# Patient Record
Sex: Female | Born: 1945 | Race: White | Hispanic: No | Marital: Single
Health system: Midwestern US, Community
[De-identification: ages and names within clinical notes are randomized; demographics above are authoritative.]

## PROBLEM LIST (undated history)

## (undated) DIAGNOSIS — Z1211 Encounter for screening for malignant neoplasm of colon: Secondary | ICD-10-CM

## (undated) DIAGNOSIS — R195 Other fecal abnormalities: Principal | ICD-10-CM

## (undated) DIAGNOSIS — K429 Umbilical hernia without obstruction or gangrene: Secondary | ICD-10-CM

## (undated) DIAGNOSIS — Z1231 Encounter for screening mammogram for malignant neoplasm of breast: Secondary | ICD-10-CM

## (undated) DIAGNOSIS — K439 Ventral hernia without obstruction or gangrene: Secondary | ICD-10-CM

## (undated) DIAGNOSIS — C3490 Malignant neoplasm of unspecified part of unspecified bronchus or lung: Principal | ICD-10-CM

---

## 2019-02-08 ENCOUNTER — Ambulatory Visit: Admit: 2019-02-08 | Discharge: 2019-02-08 | Payer: MEDICARE | Attending: Family Medicine | Primary: Family Medicine

## 2019-02-08 DIAGNOSIS — Z1231 Encounter for screening mammogram for malignant neoplasm of breast: Secondary | ICD-10-CM

## 2019-02-08 NOTE — Progress Notes (Signed)
Subjective:      Patient ID: Diana Willis is a 73 y.o. female.    well      Review of Systems   Constitutional: Negative.    All other systems reviewed and are negative.      Objective:   Physical Exam  Vitals signs reviewed.   Constitutional:       Appearance: Normal appearance.   Neurological:      Mental Status: She is alert.   Psychiatric:         Thought Content: Thought content normal.         Assessment:      1. Screening breast examination    2. Well adult exam            Plan:      Diana Willis was seen today for orders.    Diagnoses and all orders for this visit:    Screening breast examination  -     MAM DIGITAL SCREEN W OR WO CAD BILATERAL; Future    Well adult exam                Electronically signed by Ulice Dash Maalle Starrett, MD on 02/08/2019 at 12:43 PM

## 2019-02-14 NOTE — Addendum Note (Signed)
Addended by: Lynnette Caffey on: 02/14/2019 08:16 AM     Modules accepted: Level of Service

## 2019-10-02 ENCOUNTER — Telehealth

## 2019-10-02 NOTE — Telephone Encounter (Signed)
Cologuard order please

## 2019-10-18 NOTE — Telephone Encounter (Signed)
Had mammogram done a couple months ago and had it done at [facility]. Leane Call

## 2019-10-27 LAB — FECAL DNA COLORECTAL CANCER SCREENING (COLOGUARD): FIT-DNA (Cologuard): NEGATIVE

## 2019-10-30 ENCOUNTER — Encounter

## 2019-11-20 NOTE — Telephone Encounter (Signed)
pt called regards to results for cologuard and would like a called back from clinical staff

## 2019-11-22 NOTE — Telephone Encounter (Signed)
Diana Willis was contacted as part of mammography outreach. Patient has a mammography scheduled prior to outreach.     Patient had a mammogram done at [facility]. [facility]    [place]

## 2020-02-12 ENCOUNTER — Telehealth

## 2020-02-12 NOTE — Telephone Encounter (Signed)
CALLED PT AND GOT HER MAMM ORDER MADE, WILL FAX TO [facility]

## 2020-02-12 NOTE — Telephone Encounter (Signed)
-----   Message from Arnell Asal sent at 02/12/2020 11:01 AM EDT -----  Subject: Message to Provider    QUESTIONS  Information for Provider? Pt called stating she received a call from [facility] to schedule her yearly Mammogram. She stated she needs an   Order for it and asked if Dr. Malachi Carl could send an Order for the Mamm to [facility]? Please call pt to let her know. Thank you   ---------------------------------------------------------------------------  --------------  CALL BACK INFO  What is the best way for the office to contact you? OK to leave message on   voicemail  Preferred Call Back Phone Number? [phone]  ---------------------------------------------------------------------------  --------------  SCRIPT ANSWERS  Relationship to Patient? Self

## 2021-01-31 ENCOUNTER — Inpatient Hospital Stay: Admit: 2021-01-31 | Payer: MEDICARE | Primary: Family Medicine

## 2021-01-31 DIAGNOSIS — Z1231 Encounter for screening mammogram for malignant neoplasm of breast: Secondary | ICD-10-CM

## 2022-10-26 LAB — CBC WITH AUTO DIFFERENTIAL
Basophils %: 1 % (ref 0–2)
Basophils Absolute: 0.05 10*3/uL (ref 0.00–0.20)
Eosinophils %: 1 % (ref 1–4)
Eosinophils Absolute: 0.13 10*3/uL (ref 0.00–0.44)
Hematocrit: 49.2 % — ABNORMAL HIGH (ref 36.3–47.1)
Hemoglobin: 15.8 g/dL — ABNORMAL HIGH (ref 11.9–15.1)
Immature Granulocytes %: 0 %
Immature Granulocytes Absolute: 0.04 10*3/uL (ref 0.00–0.30)
Lymphocytes %: 22 % — ABNORMAL LOW (ref 24–43)
Lymphocytes Absolute: 2.15 10*3/uL (ref 1.10–3.70)
MCH: 29.7 pg (ref 25.2–33.5)
MCHC: 32.1 g/dL (ref 28.4–34.8)
MCV: 92.5 fL (ref 82.6–102.9)
MPV: 11.1 fL (ref 8.1–13.5)
Monocytes %: 8 % (ref 3–12)
Monocytes Absolute: 0.8 10*3/uL (ref 0.10–1.20)
NRBC Automated: 0 per 100 WBC
Neutrophils %: 68 % — ABNORMAL HIGH (ref 36–65)
Neutrophils Absolute: 6.64 10*3/uL (ref 1.50–8.10)
Platelets: 245 10*3/uL (ref 138–453)
RBC: 5.32 m/uL — ABNORMAL HIGH (ref 3.95–5.11)
RDW: 14.3 % (ref 11.8–14.4)
WBC: 9.8 10*3/uL (ref 3.5–11.3)

## 2022-10-26 LAB — COMPREHENSIVE METABOLIC PANEL
ALT: 13 U/L (ref 10–35)
AST: 14 U/L (ref 10–35)
Albumin/Globulin Ratio: 2 (ref 1.0–2.5)
Albumin: 4.4 g/dL (ref 3.5–5.2)
Alkaline Phosphatase: 96 U/L (ref 35–104)
Anion Gap: 10 mmol/L (ref 9–16)
BUN: 23 mg/dL (ref 8–23)
CO2: 28 mmol/L (ref 20–31)
Calcium: 9.2 mg/dL (ref 8.6–10.4)
Chloride: 103 mmol/L (ref 98–107)
Creatinine: 1 mg/dL — ABNORMAL HIGH (ref 0.50–0.90)
Est, Glom Filt Rate: 62 mL/min/{1.73_m2} (ref >60)
Glucose: 89 mg/dL (ref 74–99)
Potassium: 4.7 mmol/L (ref 3.7–5.3)
Sodium: 141 mmol/L (ref 136–145)
Total Bilirubin: 0.4 mg/dL (ref 0.00–1.20)
Total Protein: 6.8 g/dL (ref 6.6–8.7)

## 2022-10-26 LAB — LIPID PANEL
Chol/HDL Ratio: 5
Cholesterol, Total: 247 mg/dL — ABNORMAL HIGH (ref 0–199)
HDL: 46 mg/dL (ref >40)
LDL Cholesterol: 165 mg/dL — ABNORMAL HIGH (ref 0–100)
Triglycerides: 177 mg/dL — ABNORMAL HIGH (ref <150)
VLDL: 35 mg/dL

## 2022-10-26 LAB — TSH: TSH: 3.16 u[IU]/mL (ref 0.27–4.20)

## 2022-10-26 LAB — HEMOGLOBIN A1C
Estimated Avg Glucose: 114 mg/dL
Hemoglobin A1C: 5.6 % (ref 4.0–6.0)

## 2022-10-28 ENCOUNTER — Encounter

## 2022-10-29 ENCOUNTER — Encounter

## 2022-11-03 ENCOUNTER — Inpatient Hospital Stay: Admit: 2022-11-03 | Payer: MEDICARE | Primary: Family Medicine

## 2022-11-03 DIAGNOSIS — K429 Umbilical hernia without obstruction or gangrene: Secondary | ICD-10-CM

## 2022-11-05 ENCOUNTER — Encounter

## 2022-11-06 ENCOUNTER — Encounter

## 2022-11-09 ENCOUNTER — Inpatient Hospital Stay: Admit: 2022-11-09 | Discharge: 2022-11-09 | Payer: MEDICARE | Primary: Family Medicine

## 2022-11-09 DIAGNOSIS — K439 Ventral hernia without obstruction or gangrene: Secondary | ICD-10-CM

## 2022-12-04 ENCOUNTER — Ambulatory Visit: Payer: PRIVATE HEALTH INSURANCE | Primary: Family Medicine

## 2022-12-04 ENCOUNTER — Ambulatory Visit: Payer: MEDICARE | Primary: Family Medicine

## 2022-12-04 DIAGNOSIS — Z1231 Encounter for screening mammogram for malignant neoplasm of breast: Secondary | ICD-10-CM

## 2023-07-09 LAB — FECAL DNA COLORECTAL CANCER SCREENING (COLOGUARD): FIT-DNA (Cologuard): POSITIVE — AB

## 2023-07-15 IMAGING — CT CT LUNG SCREENING
2 of 4 series · 7 of 36 positions shown, 8 images · non-contrast
Comparison: None   
Count of known CT and Cardiac Nuclear Medicine studies performed in the previous 
12 months = 0.
COMPARISON: None   
Count of known CT and Cardiac Nuclear Medicine studies performed in the previous 
12 months = 0.

________________________________________________________________________________________________ 
******** ADDENDUM #1 ********/n 
CT LUNG SCREENING, 07/15/2023 [DATE]: 
CLINICAL INDICATION: Personal history of nicotine dependence. 60 pack year 
smoking history. Patient is a current smoker. 
A search for DICOM formatted images was conducted for prior CT imaging studies 
completed at a non-affiliated media free facility.
TECHNIQUE: The chest was scanned from base of neck through the lung bases 
without contrast on a high resolution low dose CT scanner. Routine MPR and MIP 
reconstruction images were performed.

[Series 7: coronal · coronal · 0.61mm/px · 3 of 134 slices shown]
[im 27/134  lung]
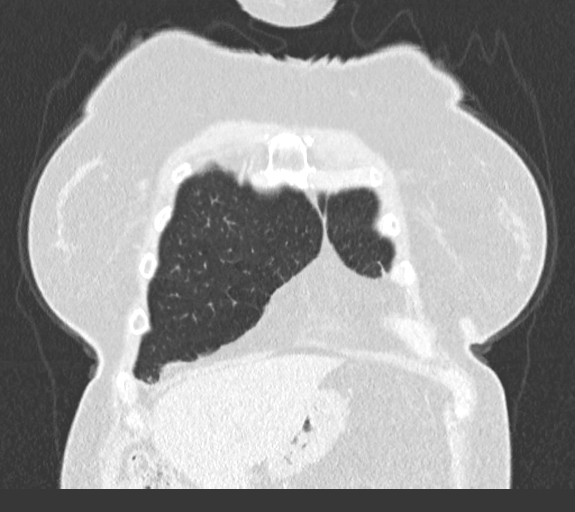
[im 54/134  lung]
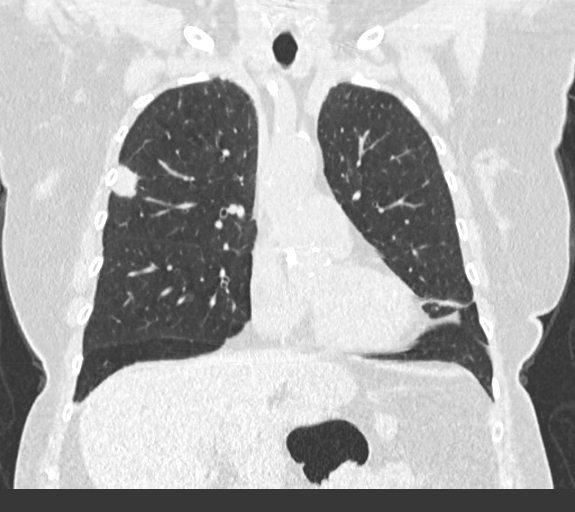
[im 80/134  lung]
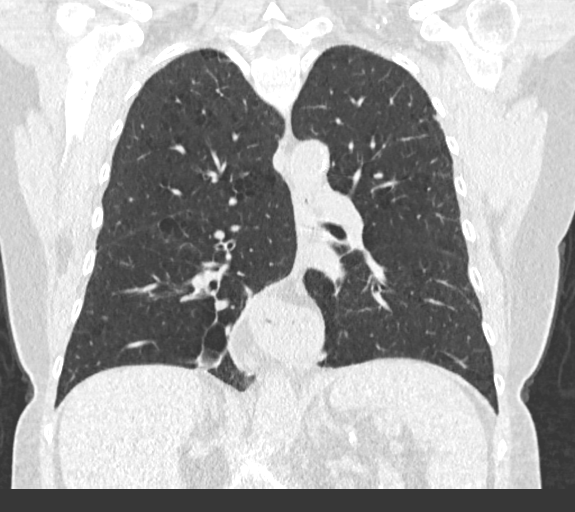

[Series 9: lung mips · axial · 0.53mm/px · z∈[+456,+605]mm · 4 of 42 slices shown, 5 images]
[im 9/42  mediastinal]
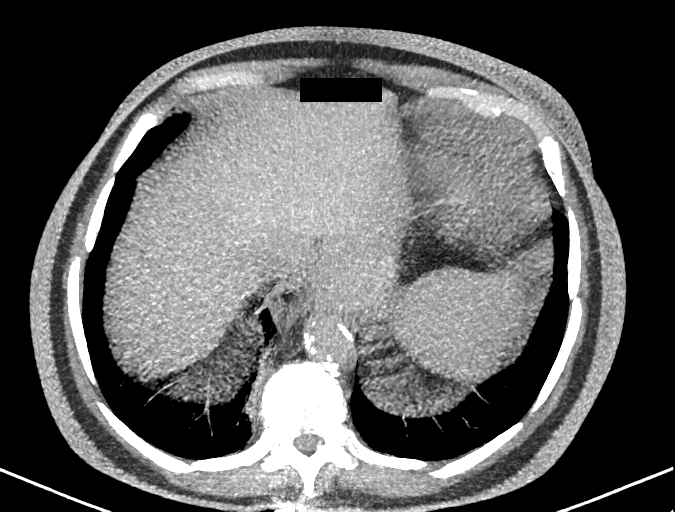
[im 9/42  lung]
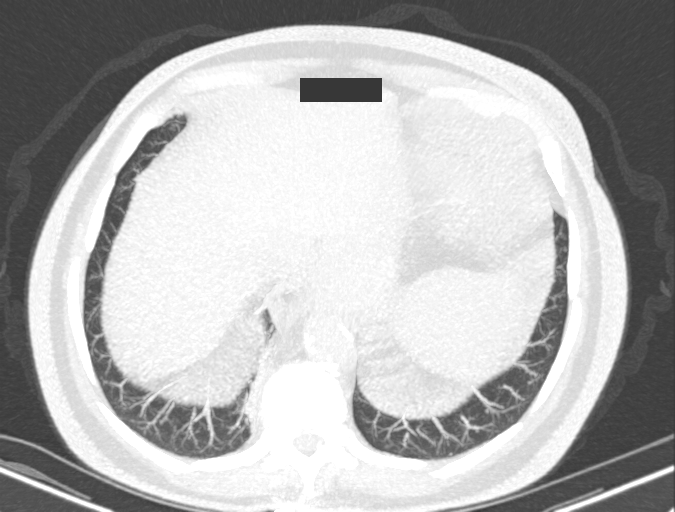
[im 17/42  lung]
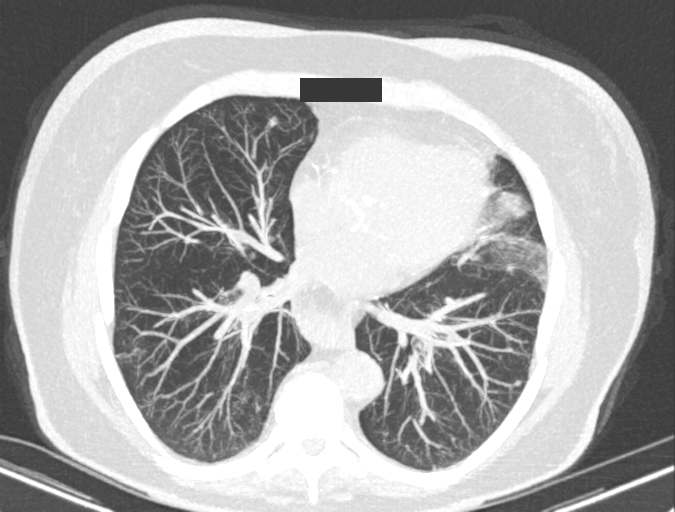
[im 25/42  lung]
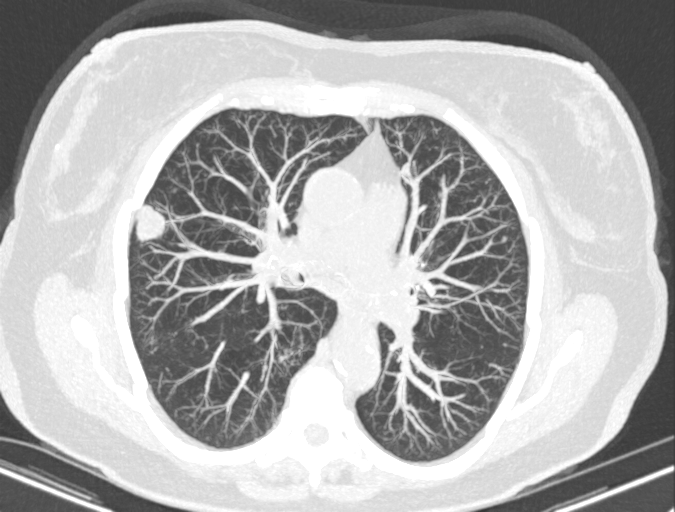
[im 33/42  lung]
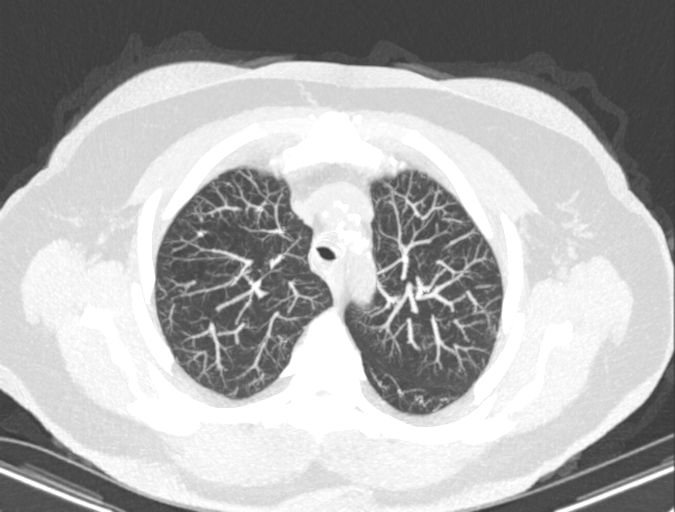

[7 of 36 positions shown; findings below may reference images not displayed]

FINDINGS: LUNGS AND PLEURA:  19.4 x 16.7 mm noncalcified spiculated RUL nodule abuts the 
lateral pleura and is consistent with malignancy until proven otherwise. Several 
scattered noncalcified pulmonary nodules, with the largest measuring 3.4 x
mm in the LLL (image 22). Moderate centrilobular emphysema and mild basilar 
atelectasis. No pleural effusion.  
MEDIASTINUM:  No adenopathy. Moderate hiatal hernia. Normal heart size. No 
pericardial effusion. Atherosclerosis and coronary arterial calcifications. 
CHEST WALL/AXILLA: No mass or adenopathy.  
UPPER ABDOMEN: Negative. 
MUSCULOSKELETAL: Degenerative change and mild chronic T11 loss of height and 
osteopenia.
IMPRESSION: 1.  19.4 x 16.7 mm noncalcified spiculated RUL nodule abuts the lateral pleura 
and is consistent with malignancy until proven otherwise. PET/CT and pulmonary 
consultation may be helpful for further evaluation. 
2.  Several scattered noncalcified pulmonary nodules (up to 3.4 x 3.3 mm).  
3.  Moderate centrilobular emphysema and mild basilar atelectasis.  
4.  Moderate hiatal hernia.  
5.  Degenerative change and mild chronic T11 loss of height and osteopenia. 
L-RADS: Category 4X (suspicious, >15% chance of malignancy) 
PET/CT may be used if there is a greater than or equal to 8 mm solid component 

RADIATION DOSE REDUCTION: All CT scans are performed using radiation dose 
reduction techniques, when applicable.  Technical factors are evaluated and 
adjusted to ensure appropriate moderation of exposure.  Automated dose 
management technology is applied to adjust the radiation doses to minimize 
exposure while achieving diagnostic quality images. 
CT LUNG SCREENING, 07/15/2023 [DATE]: 
CLINICAL INDICATION: Personal history of nicotine dependence. 60 pack year 
smoking history. Patient is a current smoker. 
A search for DICOM formatted images was conducted for prior CT imaging studies 
completed at a non-affiliated media free facility.
FINDINGS: LUNGS AND PLEURA:  19.4 x 16.7 cm noncalcified spiculated RUL nodule abuts the 
lateral pleura and is consistent with malignancy until proven otherwise. Several 
scattered noncalcified pulmonary nodules, with the largest measuring 3.4 x
mm in the LLL (image 22). Moderate centrilobular emphysema and mild basilar 
atelectasis. No pleural effusion.  
MEDIASTINUM:  No adenopathy. Moderate hiatal hernia. Normal heart size. No 
pericardial effusion. Atherosclerosis and coronary arterial calcifications. 
CHEST WALL/AXILLA: No mass or adenopathy.  
UPPER ABDOMEN: Negative. 
MUSCULOSKELETAL: Degenerative change and mild chronic T11 loss of height and 
osteopenia.
IMPRESSION: 1.  19.4 x 16.7 cm noncalcified spiculated RUL nodule abuts the lateral pleura 
and is consistent with malignancy until proven otherwise. PET/CT and pulmonary 
consultation may be helpful for further evaluation. 
2.  Several scattered noncalcified pulmonary nodules (up to 3.4 x 3.3).  
3.  Moderate centrilobular emphysema and mild basilar atelectasis.  
4.  Moderate hiatal hernia.  
5.  Degenerative change and mild chronic T11 loss of height and osteopenia. 
L-RADS: Category 4X (suspicious, >15% chance of malignancy) 
PET/CT may be used if there is a greater than or equal to 8 mm solid component 

RADIATION DOSE REDUCTION: All CT scans are performed using radiation dose 
reduction techniques, when applicable.  Technical factors are evaluated and 
adjusted to ensure appropriate moderation of exposure.  Automated dose 
management technology is applied to adjust the radiation doses to minimize 
exposure while achieving diagnostic quality images.

## 2023-07-15 IMAGING — CT CT ABDOMEN AND PELVIS W/WO CONTRAST
2 of 4 series · 15 of 46 positions shown, 17 images · IV contrast (APPLIED)
Comparison: None 
Count of known CT and Cardiac Nuclear Medicine studies performed in the previous 
12 months = 0.

________________________________________________________________________________________________ 
CT ABDOMEN AND PELVIS W/WO CONTRAST, 07/15/2023 [DATE]: 
CLINICAL INDICATION: Diaphragmatic Hernia Without Obstruction Or Gangrene 
A search for DICOM formatted images was conducted for prior CT imaging studies 
completed at a non-affiliated media free facility.
TECHNIQUE: The abdomen and pelvis were scanned from lung bases through the pubic 
rami without and with 75 mL of Isovue 300 MDV contrast injected intravenously on 
a high-resolution CT scanner using dose reduction techniques. Routine MPR 
reconstructions were performed.

[Series 11: abd/pel ax w · axial · 0.76mm/px · z∈[+91,+475]mm · 12 of 144 slices shown, 14 images]
[im 8/144  soft-tissue]
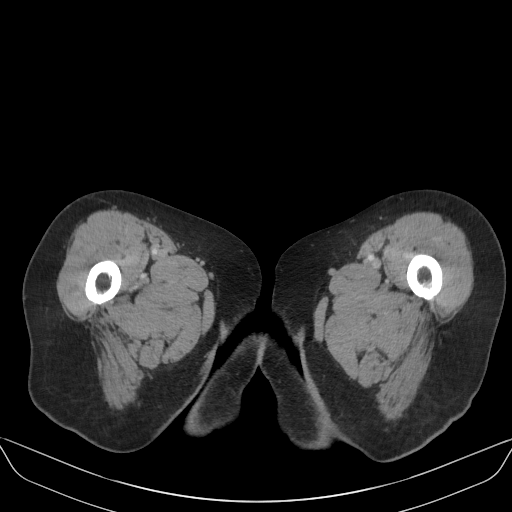
[im 8/144  bone]
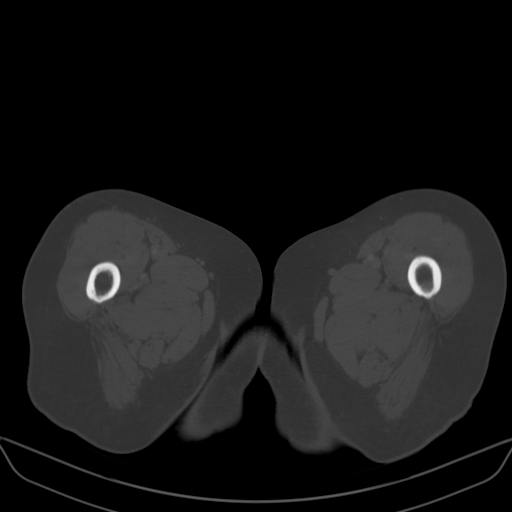
[im 22/144  soft-tissue]
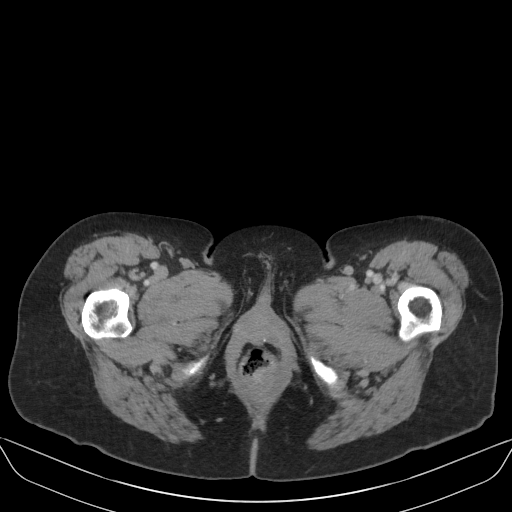
[im 29/144  soft-tissue]
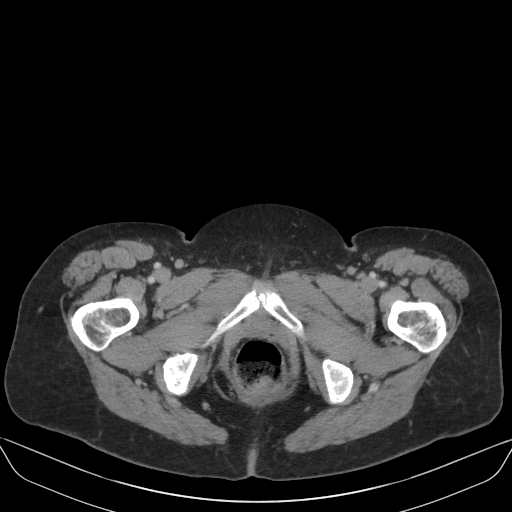
[im 43/144  soft-tissue]
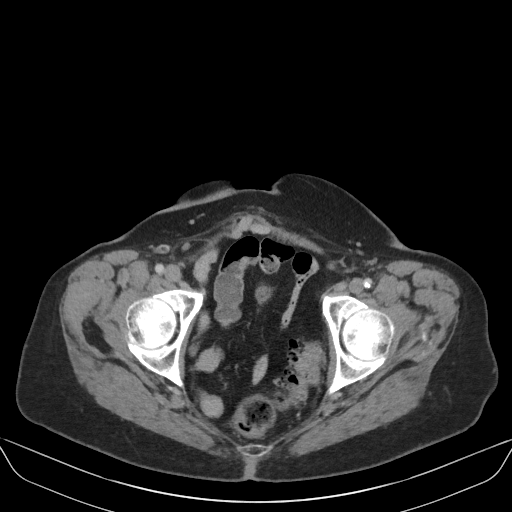
[im 58/144  soft-tissue]
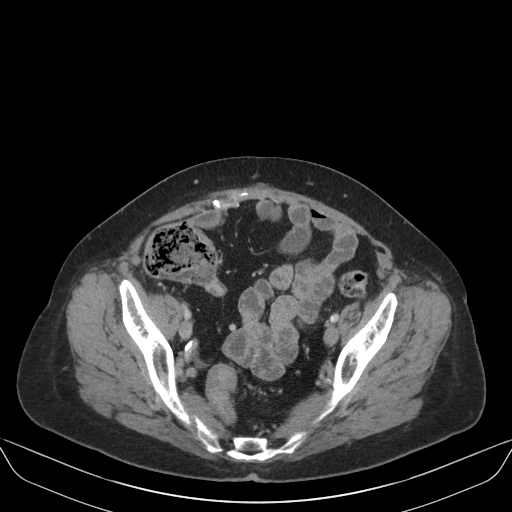
[im 65/144  soft-tissue]
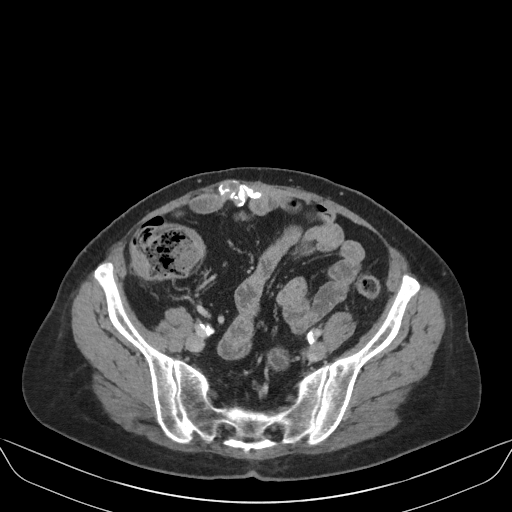
[im 79/144  soft-tissue]
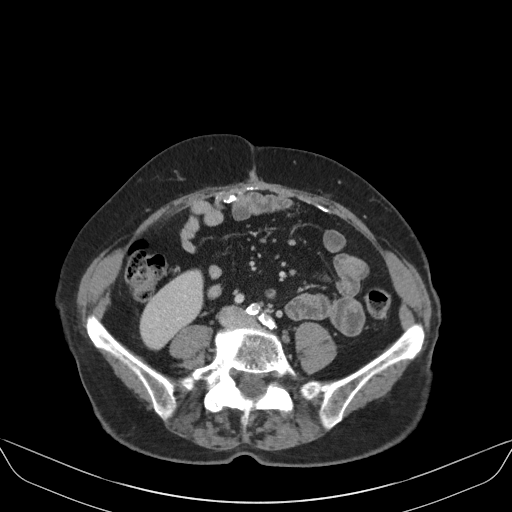
[im 86/144  soft-tissue]
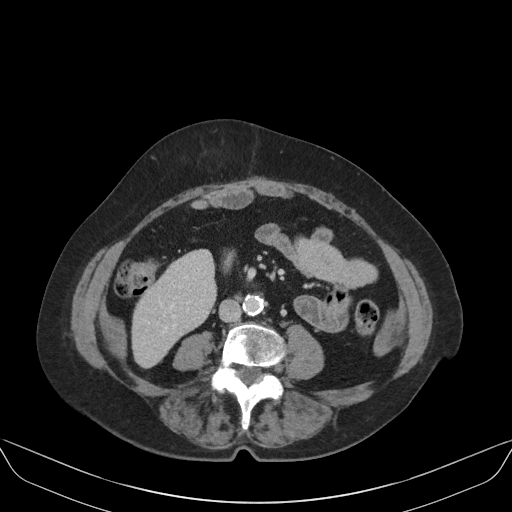
[im 101/144  soft-tissue]
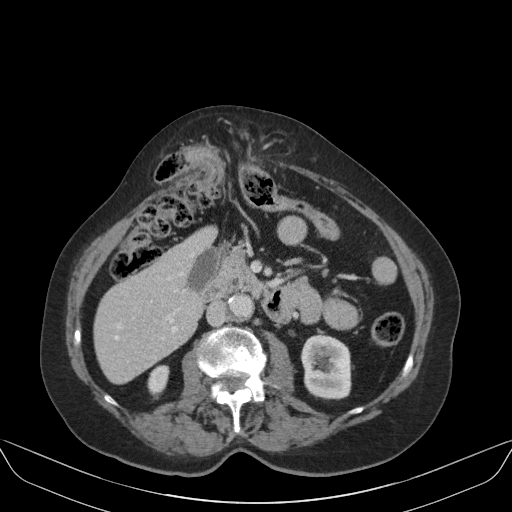
[im 101/144  bone]
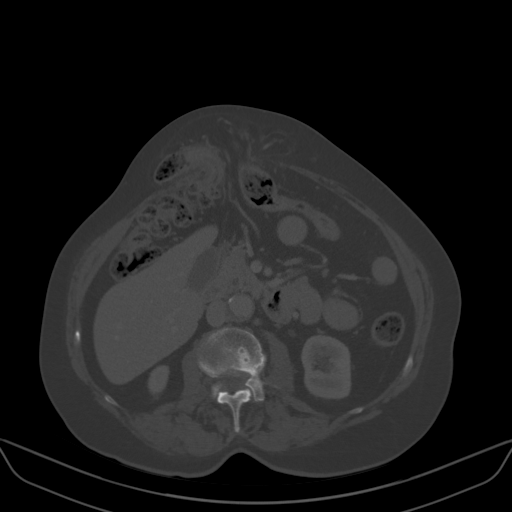
[im 115/144  soft-tissue]
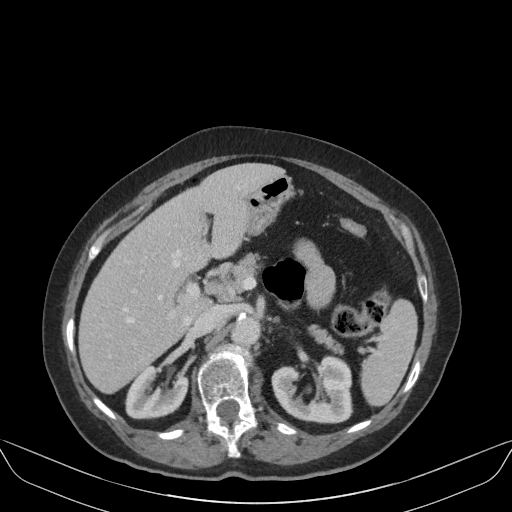
[im 122/144  soft-tissue]
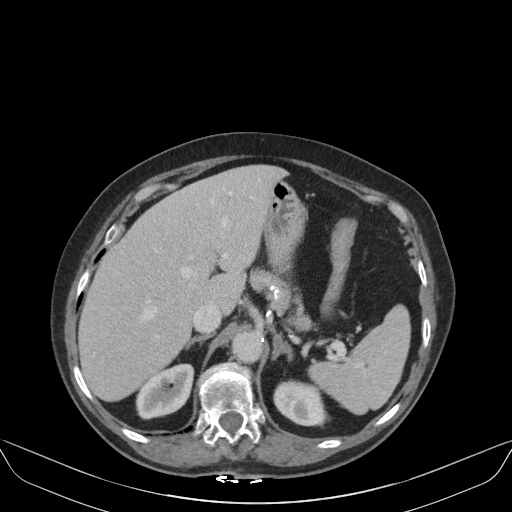
[im 136/144  soft-tissue]
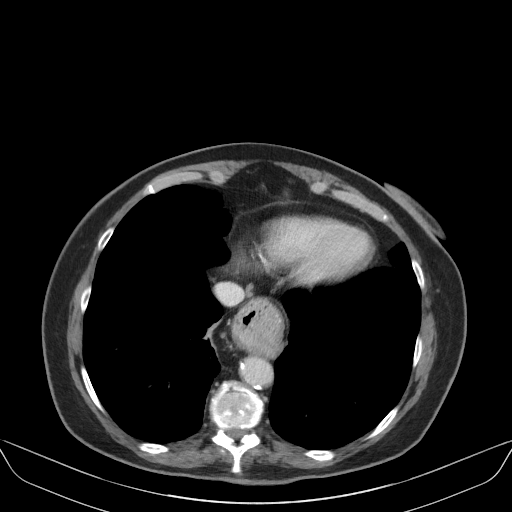

[Series 12: abd/pel cor w · coronal · 0.80mm/px · 3 of 155 slices shown]
[im 52/155  soft-tissue]
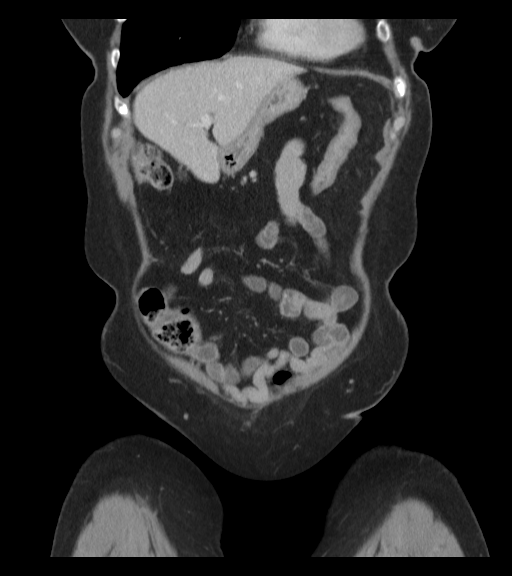
[im 69/155  soft-tissue]
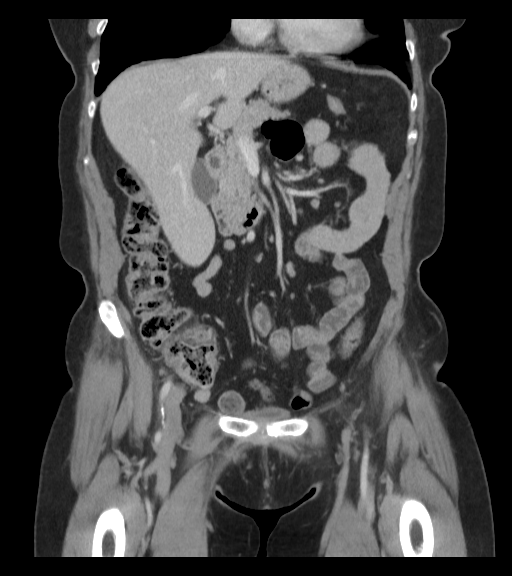
[im 86/155  soft-tissue]
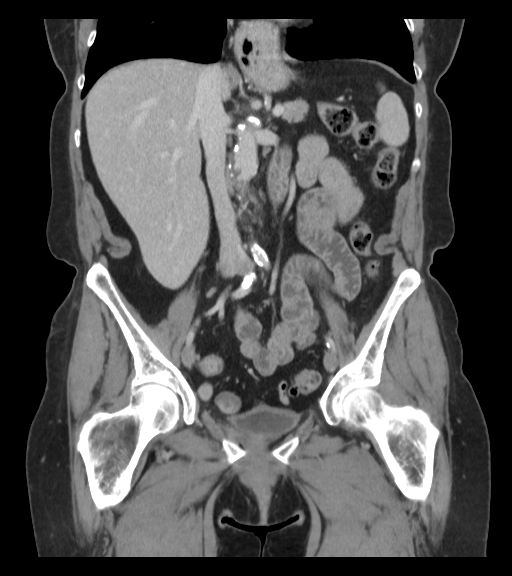

[15 of 46 positions shown; findings below may reference images not displayed]

FINDINGS: LUNG BASES: Localizer view demonstrates right upper lobe mass, consistent with 
malignancy interpreted otherwise. Scattered basilar noncalcified micronodules, 
basilar centrilobular emphysema and atelectasis. No pleural effusions. 
HEPATOBILIARY: No mass or biliary dilatation. No gallstones. 
SPLEEN: Normal in size. 
PANCREAS: No ductal dilatation or mass.   
ADRENALS: No mass. 
GENITOURINARY: No enhancing mass or hydronephrosis.  Bladder is unremarkable. 
Hysterectomy. 
LYMPH NODES: No adenopathy. 
STOMACH, SMALL BOWEL AND COLON: Moderate hiatal hernia. Nonobstructed transverse 
colon extends into the right paramidline ventral hernia. Colonic diverticulosis 
without evidence of diverticulitis. No bowel wall thickening or obstruction. 
VASCULAR STRUCTURES: No aneurysm. Atherosclerosis. 
MUSCULOSKELETAL: Bilateral paramidline supraumbilical ventral hernias contains 
nonobstructed transverse colon on the right and mesenteric fat on the left. The 
right hernia wall defect measures 1.4 x 2.6 cm and the hernia sac measures up to 
9.6 cm. Prior ventral herniorrhaphy with partially detached mesh and coarse 
calcifications. Degenerative change, L5-S1 grade 1 anterolisthesis and 
osteopenia. 
ADDITIONAL FINDINGS: Pelvic floor laxity.
IMPRESSION: 1.  Localizer view demonstrates right upper lobe mass, consistent with 
malignancy until proven otherwise. Please refer to 07/15/2023 lung cancer 
screening CT for additional findings. 
2.  No evidence of abdominal/pelvic metastases. 
3.  Moderate hiatal hernia. 
4.  Colonic diverticulosis. 
5.  Bilateral paramidline supraumbilical ventral hernias contains nonobstructed 
transverse colon on the right and mesenteric fat on the left. Prior ventral 
herniorrhaphy with partially detached mesh.  
6.  Atherosclerosis. 
7.  Pelvic floor laxity. 
8.  Degenerative change, L5-S1 grade 1 anterolisthesis and osteopenia. 
RADIATION DOSE REDUCTION: All CT scans are performed using radiation dose 
reduction techniques, when applicable.  Technical factors are evaluated and 
adjusted to ensure appropriate moderation of exposure.  Automated dose 
management technology is applied to adjust the radiation doses to minimize 
exposure while achieving diagnostic quality images.

## 2023-07-23 IMAGING — CT PET CT SCAN TUMOR IMAGING SKULL TO THIGH
3 series · 25 of 25 positions shown · non-contrast
Comparison: CT scan 07/15/2023

________________________________________________________________________________________________ 
******** ADDENDUM #1 ********/n 
Was requested to confirm that the nodule in the right upper lobe is correctly 
measured. I am reviewing the films and on image 68 the lesion measures 1.9 x
cm. On the diagnostic exam from [DATE] this was stated to measure 1.9 x 1.7 cm. 
The 1 mm difference could be within measurement error. The smaller pulmonary 
nodules not as well seen on this low-dose CT obtained for PET scan. 
PET CT SCAN TUMOR IMAGING SKULL TO THIGH, 07/23/2023 [DATE]: 
CLINICAL INDICATION: Other Nonspecific Abnormal Finding Of Lung Field 
Diagnostic
TECHNIQUE: A dose of 11.9 mCi of F-18 FDG was administered intravenously and 
skull to thigh PET scanning was performed at 60 minutes. Tomographic scans were 
reconstructed in axial, coronal, and sagittal projections. The data was 
reconstructed into a three-dimensional volume rendered image and reviewed in a 
rotational cine loop. Serum blood glucose at the time of injection was 99 mg/dl.

[Series 201: body-low dose ct, idose (4) · axial · 4.0mm · 1.17mm/px · z∈[-1177,-329]mm · 9 of 213 slices shown]
[im 1/213]
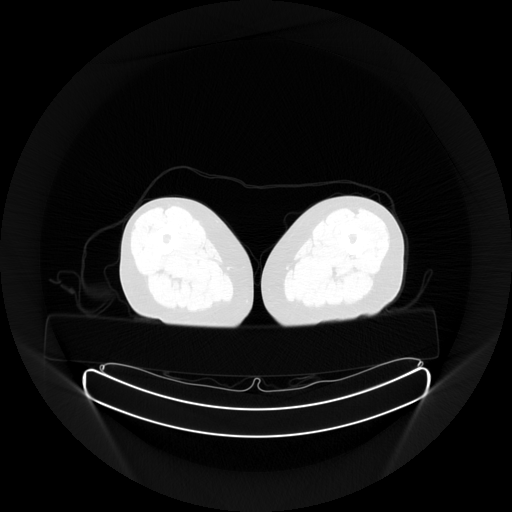
[im 27/213]
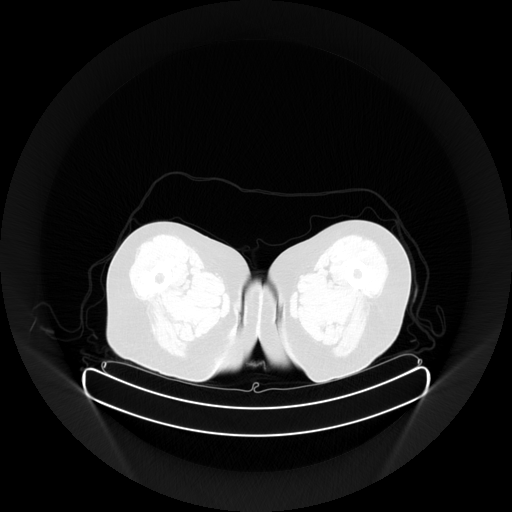
[im 54/213]
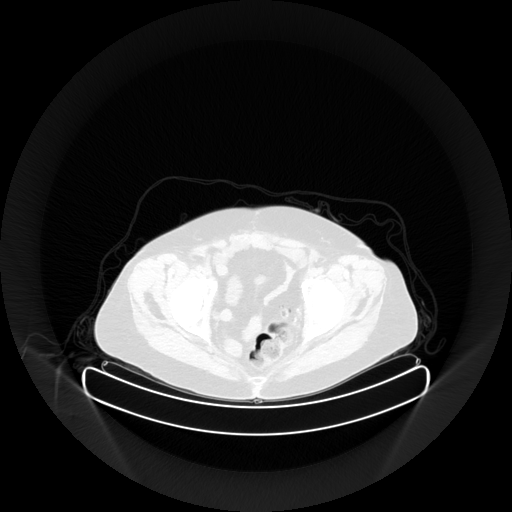
[im 80/213]
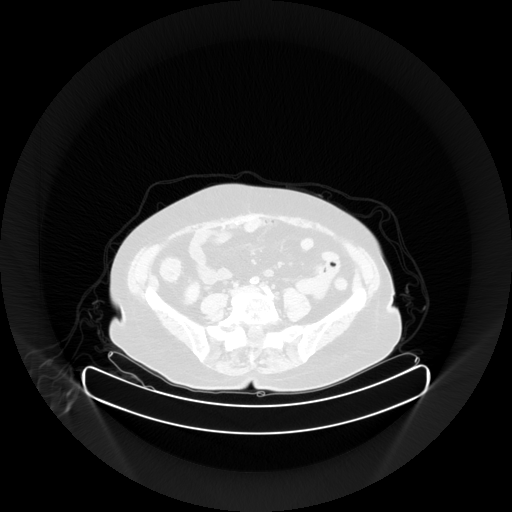
[im 107/213]
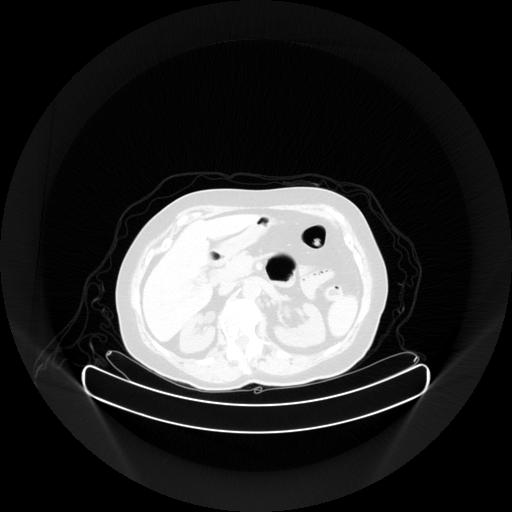
[im 133/213]
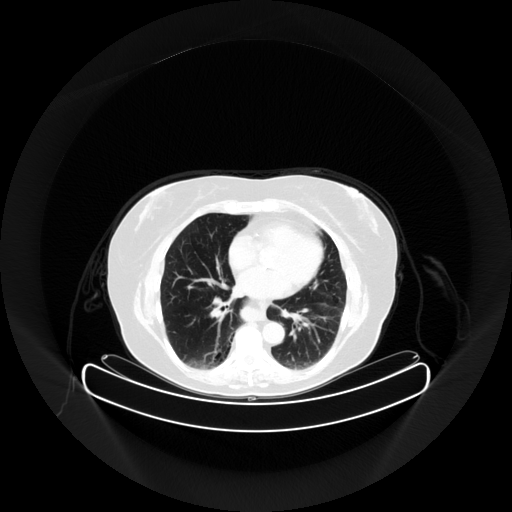
[im 160/213]
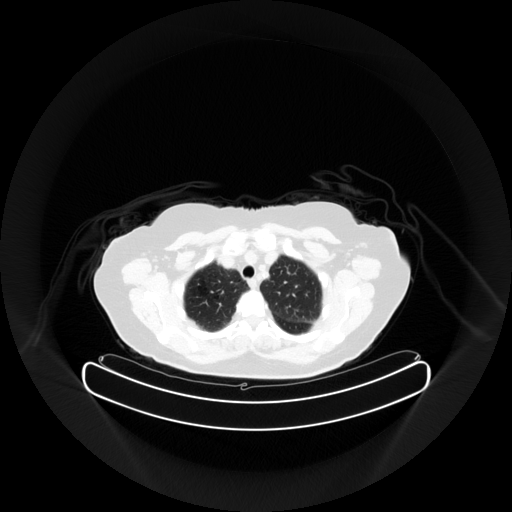
[im 186/213]
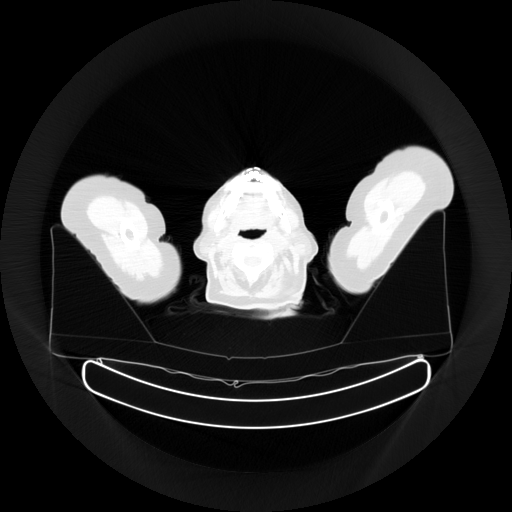
[im 213/213]
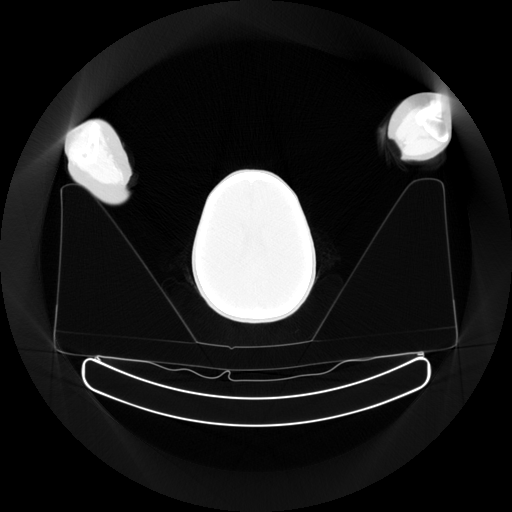

[Series 4546: (wb_nac) body · axial · 4.0mm · 4.00mm/px · z∈[-1177,-329]mm · 8 of 213 slices shown]
[im 1/213]
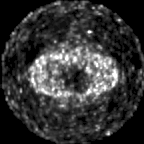
[im 31/213]
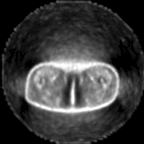
[im 61/213]
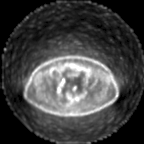
[im 91/213]
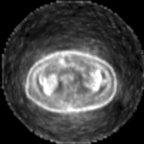
[im 122/213]
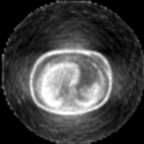
[im 152/213]
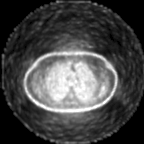
[im 182/213]
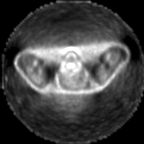
[im 213/213]
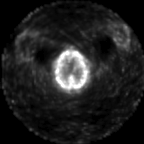

[Series 4547: (wb_ctac) body · axial · 4.0mm · 4.00mm/px · z∈[-1177,-329]mm · 8 of 213 slices shown]
[im 1/213]
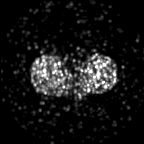
[im 31/213]
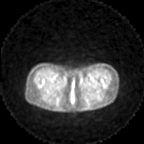
[im 61/213]
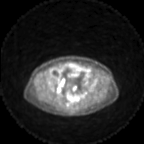
[im 91/213]
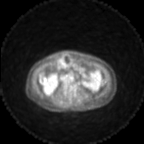
[im 122/213]
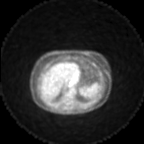
[im 152/213]
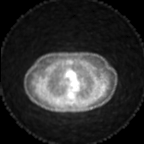
[im 182/213]
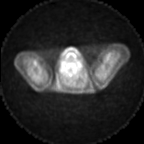
[im 213/213]
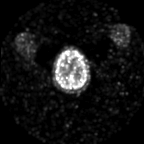

[25 of 25 positions shown; findings below may reference images not displayed]

FINDINGS: NECK/CHEST: There is activity identified involving the parotid glands 
bilaterally. On the left on image 29 there is a 1.5 x 0.8 cm nodule with a 
maximum SUV of 5.4. A smaller 0.5 x 0.4 cm nodule seen on the right with maximum 
SUV of 2.3. No adenopathy or additional abnormal activity in the neck. 
There is a pulmonary lesion in the right upper lobe. Currently measures 
approximately 1.9 x 1.6 cm. Significantly FDG avid with a maximum SUV of 15.0. 
No additional FDG avid nodule seen. No adenopathy identified. 
ABDOMEN/PELVIS: No abnormal FDG uptake. 
MUSCULOSKELETAL: No abnormal FDG uptake. 
ADDITIONAL CT FUSION FINDINGS: Emphysematous changes. Atherosclerotic changes 
with at least mild coronary calcifications. Degenerative changes. There is a 
moderate to large hiatal hernia. Ventral hernia described previously containing 
fat and nonobstructing portions of the transverse colon. Below the hernia are 
changes of previous hernia repair. Previous hysterectomy.
IMPRESSION: Findings concerning for primary malignancy right upper lobe without metastatic 
disease seen. 
Findings also concerning for bilateral parotid lesions. Recommend ENT 
consultation to better evaluate this region. 
Postop changes seen within the abdomen with anterior abdominal wall hernias 
containing fat and nonobstructing portions of the transverse colon as was seen 
on recent CT scan from 07/15/2023. At least moderate-sized hiatal hernia. 
Emphysematous changes, atherosclerotic changes and degenerative changes.

## 2023-08-05 IMAGING — MR MRI BRAIN W/WO CONTRAST
4 of 25 series · 6 of 48 positions shown · IV contrast (gadavist)
Comparison: None.

________________________________________________________________________________________________ 
******** ADDENDUM #1 ********/n 
Comparison made to PET CT from July 23, 2023. On that examination there was 
activity seen in bilateral parotid lesions. On the MRI, upon further review 
there is faint visualization of parotid lesions along the inferior margins of 
the parotid glands. This is not well assessed on this examination and is best 
seen on coronal T2 sequence and not included in the field-of-view of axial 
sequences. Recommend dedicated MRI neck without and with contrast for further 
assessment. 
MRI BRAIN W/WO CONTRAST, 08/05/2023 [DATE]: 
CLINICAL INDICATION: Lung cancer.
TECHNIQUE: Multiplanar, multiecho position MR images of the brain were performed 
without and with 6 mL of Gadavist were injected intravenously by hand. 1.5 mL of 
Gadavist discarded. Patient was scanned on a 1.5T magnet.

[T1 · coronal · 1.0mm · 0.24mm/px · 3 of 180 slices shown (1 of 2)]
[im 36/180]
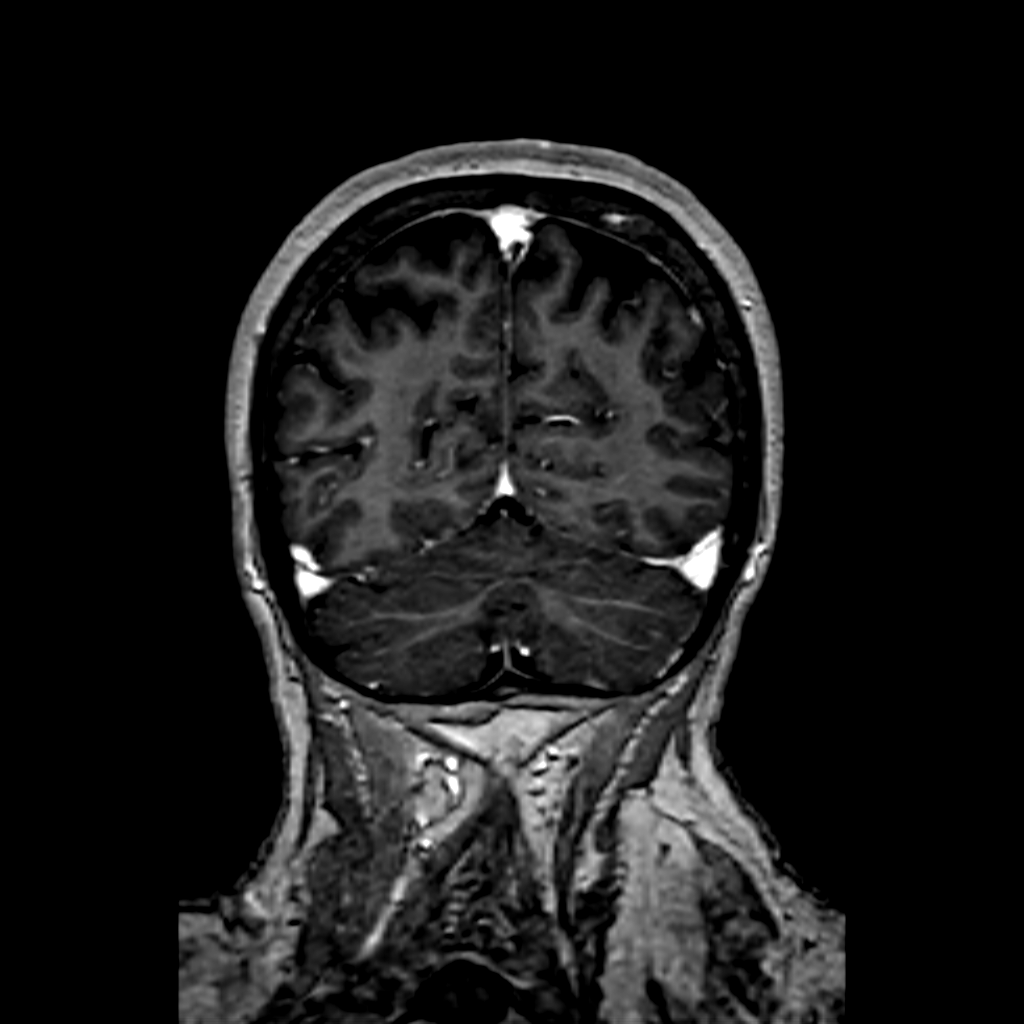
[im 108/180]
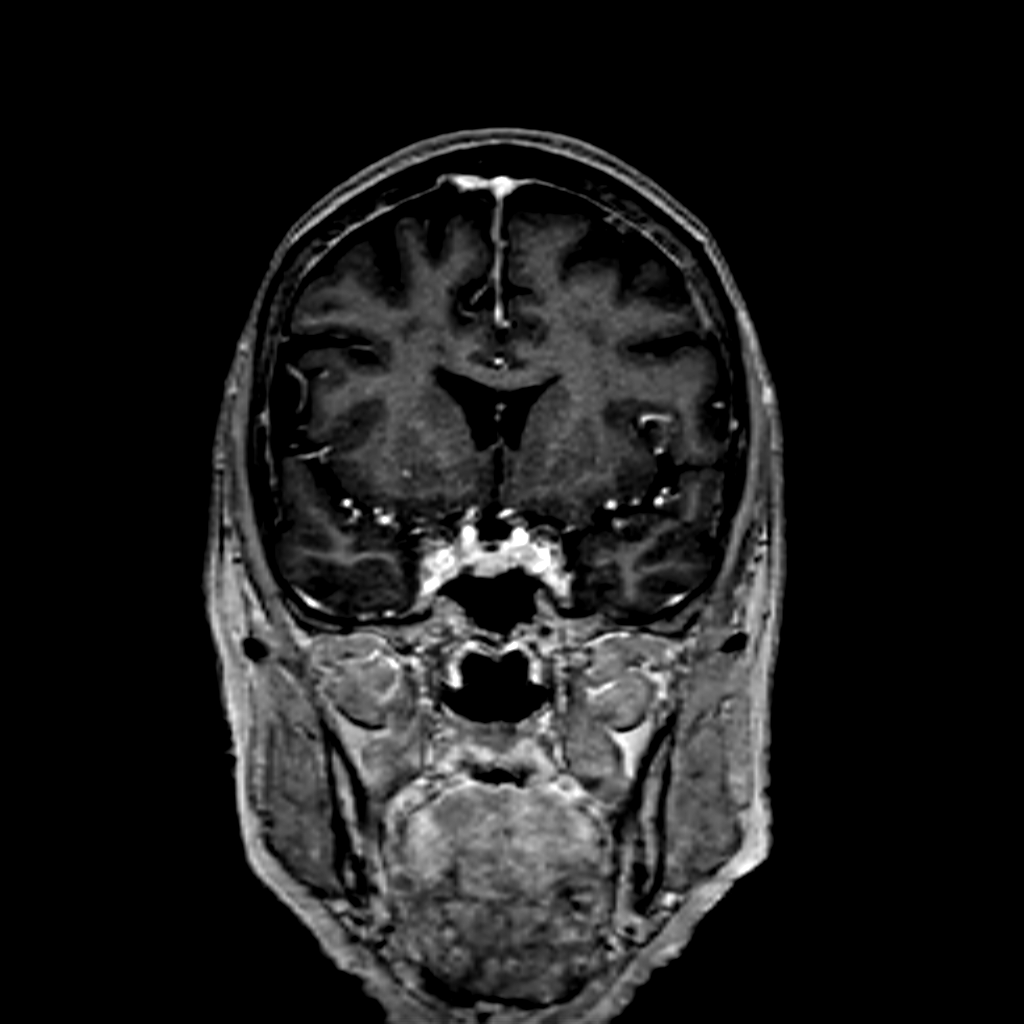
[im 180/180]
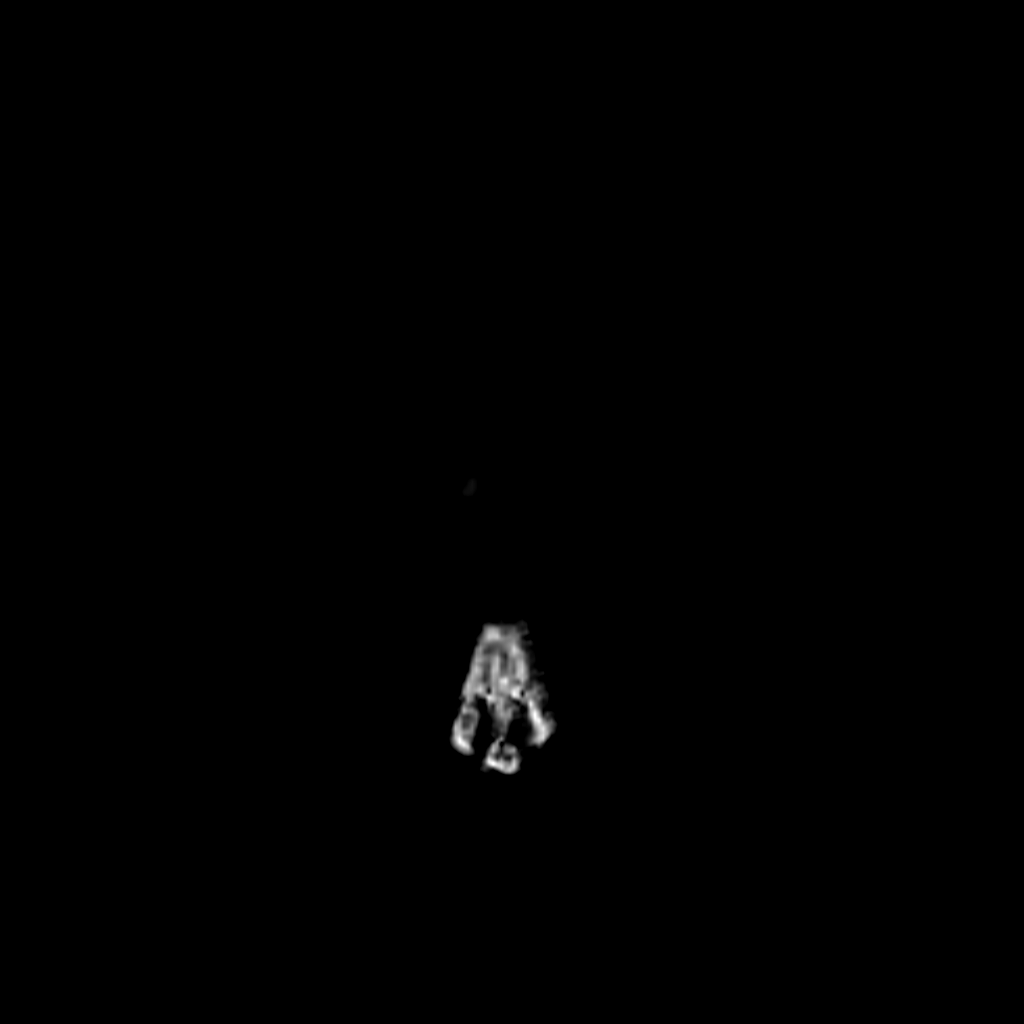

[T1 · axial · 1.0mm · 0.24mm/px · 1 of 170 slices shown (2 of 2)]
[im 34/170]
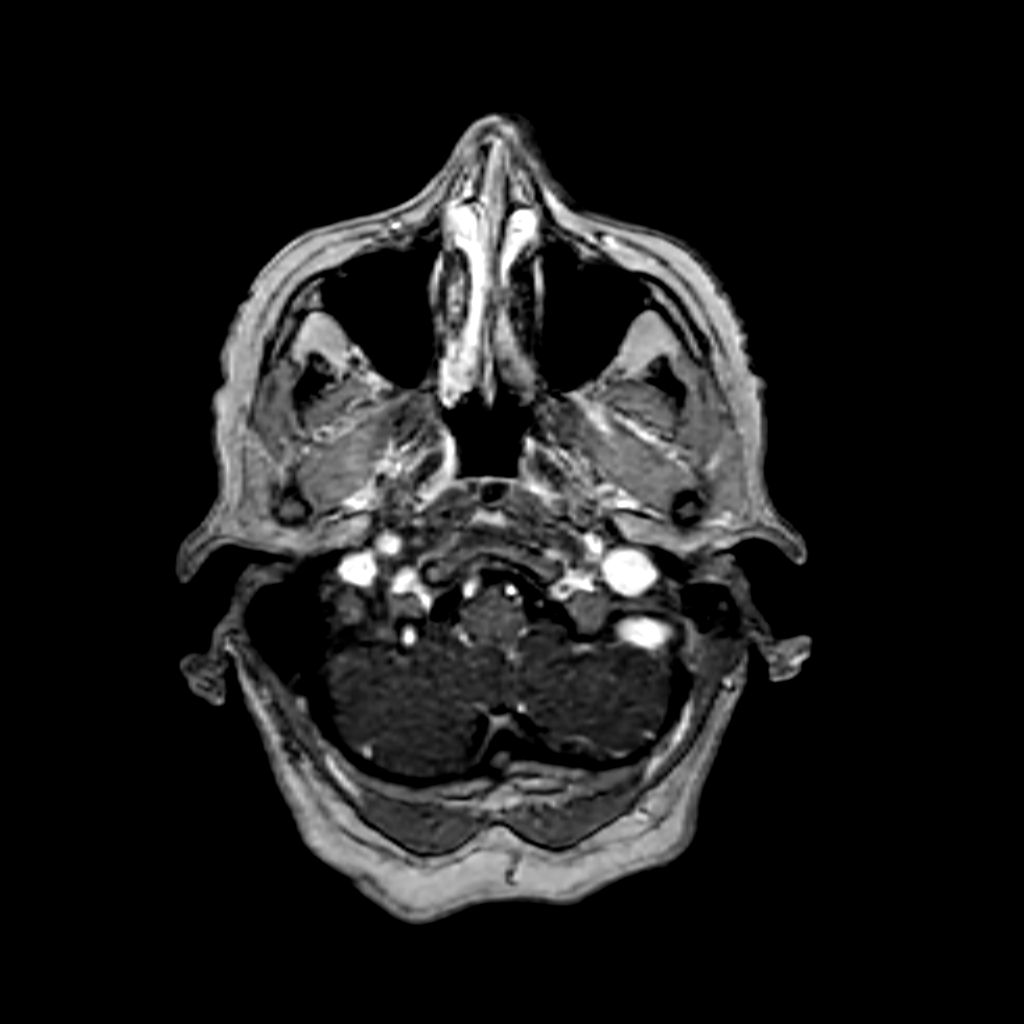

[T1 post-contrast · axial · 5.0mm · 0.29mm/px · 1 of 27 slices shown (1 of 2)]
[im 1/27]
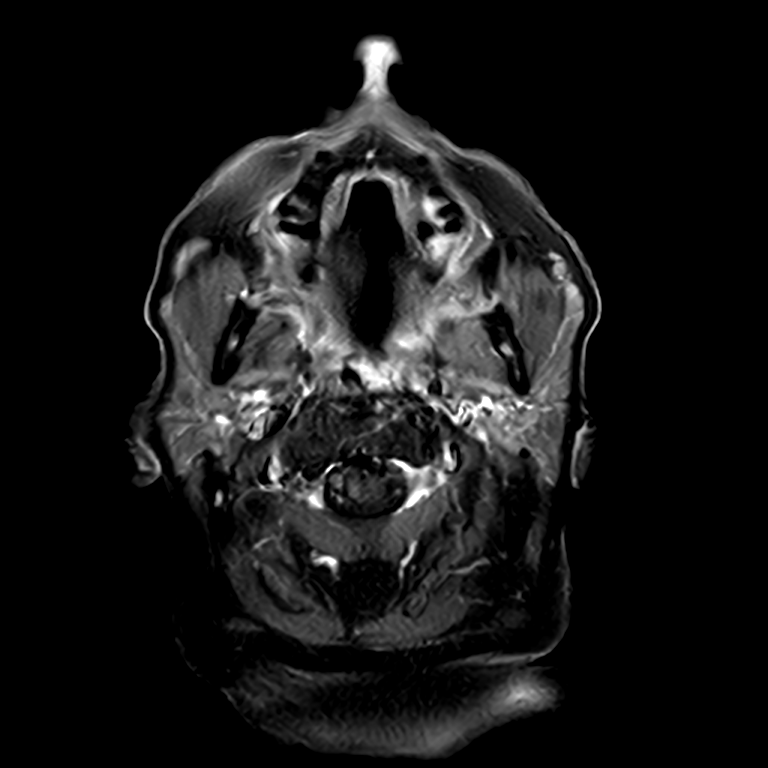

[T1 post-contrast · axial · 5.0mm · 0.29mm/px · 1 of 27 slices shown (2 of 2)]
[im 1/27]
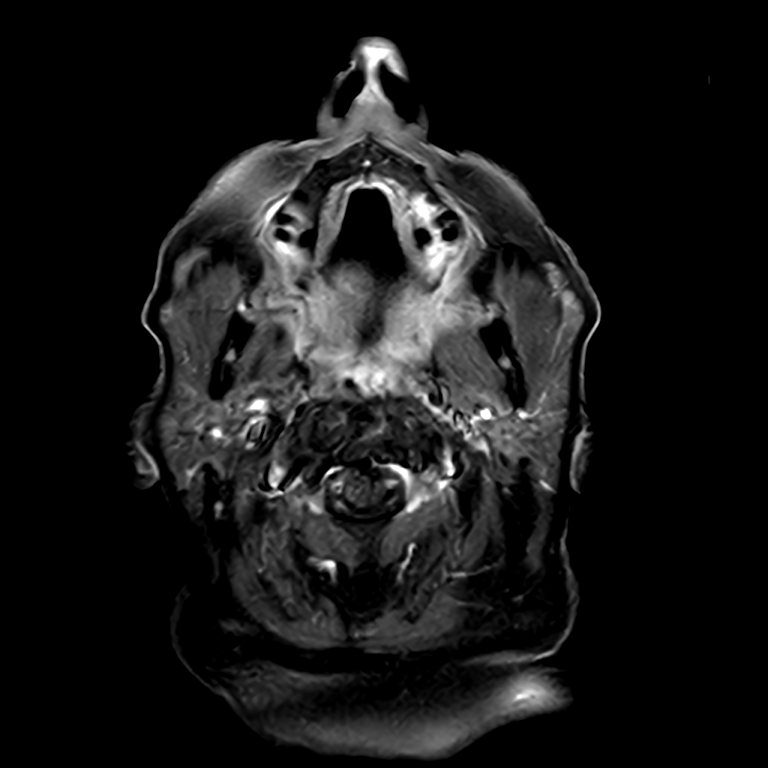

[6 of 48 positions shown; findings below may reference images not displayed]

FINDINGS: -------------------------------------------------------------------------------- 
------------------------- 
INTRACRANIAL: 
No acute ischemia. No abnormal foci of susceptibility artifact in the brain. 
Patency of intracranial vascular flow voids. Periventricular and deep white 
matter change, probably secondary to microangiopathy. This is moderate. No acute 
intracranial hemorrhage, mass effect, midline shift. No pathologic enhancement 
in the brain. 
-------------------------------------------------------------------------------- 
----------------------- 
OTHER: 
ORBITS/SINUSES/T-BONES:  Status post bilateral cataract extraction.  Trace fluid 
in left mastoid process.  Visualized paranasal sinuses are clear. 
BONE/SOFT TISSUES: No mass or acute abnormality.  
-------------------------------------------------------------------------------- 
-------------------
IMPRESSION: No acute intracranial abnormality or pathologic enhancement.  White matter 
microangiopathic changes.

## 2023-08-19 ENCOUNTER — Institutional Professional Consult (permissible substitution)
Admit: 2023-08-19 | Discharge: 2023-08-19 | Payer: PRIVATE HEALTH INSURANCE | Attending: Hematology & Oncology | Primary: Family Medicine

## 2023-08-19 VITALS — BP 110/62 | HR 84 | Temp 98.50000°F | Resp 18 | Wt 136.2 lb

## 2023-08-19 DIAGNOSIS — C3490 Malignant neoplasm of unspecified part of unspecified bronchus or lung: Secondary | ICD-10-CM

## 2023-08-19 NOTE — Telephone Encounter (Signed)
 Name: Kylah Puricelli  DOB: 1946/04/09  MRN: [id number]    Oncology Navigation- Initial Note:  (Unknown)  Intake-  Contact Type: Telephone    [facility]    Smoking hx:     Notes: Navigator received referral from Dr. Alvin Axe and adding self to care team. Pt. Added to Tumor Board for next week. Plan to reach out to pt. in AM.     Electronically signed by Michaelene Admire, RN on 08/19/2023 at 4:12 PM

## 2023-08-19 NOTE — Progress Notes (Signed)
 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  CURRENT THERAPY:  Workup in progress  BRIEF CASE HISTORY:   Diana Hymon is a very pleasant 78 y.o. female who is referred to us  for management recommendation regarding her recently diagnosed right upper lobe lung cancer.  She had a ventral abdominal hernia and as part of the workup in preparation for surgery, she had a CT scan that showed right upper lobe lung mass.  There was some question about the size of the mass but ultimately it was determined that it is 1.9 x 1.6 cm.  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  All that was done in [place]  and we are trying to obtain records to confirm especially the pathology.  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  She just quit smoking yesterday.  She decided to move to [place]  to be treated for years..   She is sent to us  for a consultation, she is in good health and she does not take any medication.  Performance status is excellent, she playsPickle ball almost daily and she is able to walk without any limitation.  I would estimate her performance status ECOG0.        PAST MEDICAL HISTORY: has no past medical history on file.    PAST SURGICAL HISTORY: has a past surgical history that includes Hysterectomy and Ovary removal.     CURRENT MEDICATIONS:  currently has no medications in their medication list.    ALLERGIES:  has no known allergies.    FAMILY HISTORY: Negative for any hematological or oncological conditions.     SOCIAL HISTORY:  reports that she quit smoking yesterday. Her smoking use included cigarettes. She has a 25 pack-year smoking history. She has never used smokeless tobacco. She reports current alcohol use of about 1.0 standard drink of alcohol per week.    REVIEW OF SYSTEMS:   General: no fever or night sweats, Weight is stable.  ENT: No double or blurred vision, no tinnitus or hearing problem, no dysphagia or sore throat   Respiratory: No chest pain, no  shortness of breath, no cough or hemoptysis.  Cardiovascular: Denies chest pain, PND or orthopnea. No L E swelling or palpitations.   Gastrointestinal:    No nausea or vomiting, abdominal pain, diarrhea or constipation.    Genitourinary: Denies dysuria, hematuria, frequency, urgency or incontinence.    Neurological: Denies headaches, decreased LOC, no sensory or motor focal deficits.   Musculoskeletal:  No arthralgia no back pain or joint swelling.   Skin: There are no rashes or bleeding.  Psychiatric:  No anxiety, no depression.   Endocrine: no diabetes or thyroid disease.   Hematologic: no bleeding , no adenopathy.    PHYSICAL EXAM: Shows a well appearing 78 y.o.-year-old female who is not in pain or distress. Vital Signs: Blood pressure 110/62, pulse 84, temperature 98.5 F (36.9 C), temperature source Temporal, resp. rate 18, weight 61.8 kg (136 lb 3.2 oz), SpO2 93%. HEENT: Normocephalic and atraumatic. Pupils are equal, round, reactive to light and accommodation. Extraocular muscles are intact. Neck: Showed no JVD, no carotid bruit . Lungs: Clear to auscultation bilaterally. Heart: Regular without any murmur. Abdomen: Soft, nontender. No hepatosplenomegaly.  Ventral hernia extremities: Lower extremities show no edema, clubbing, or cyanosis. Breasts: Examination not done today. Neuro exam: intact cranial nerves bilaterally no motor or sensory deficit, gait is normal. Lymphatic: no adenopathy appreciated in the supraclavicular, axillary, cervical or  inguinal area    REVIEW OF LABORATORY DATA:     REVIEW OF RADIOLOGICAL RESULTS:     IMPRESSION:   Right upper lobe lung cancer, clinical stage T1 N0 M0  PLAN:   I had a long discussion with the patient.  Records from [place]  were reviewed in details.  We will obtain the records especially the imaging studies and pathology and review them.  We will present the case in the cancer conference.  We discussed that treatment for early-stage lung cancer which is likely  surgery versus radiation.  The patient is clearly prefers radiation if possible.  Will present the case at the cancer conference and will discuss that with the thoracic surgeon and radiation oncologist.  The patient made quit smoking yesterday and was encouraged to stay quit  Patient had multiple questions that were answered to her her satisfaction    Ali Ink Al-Nsour,MD  Hematologist/Medical Oncologist  [facility] hematology oncology physicians    I spent a total of 60 minutes on the date of the service which included preparing to see the patient, face-to-face patient care, completing clinical documentation, obtaining and/or reviewing separately obtained history, performing a medically appropriate examination, counseling and educating the patient/family/caregiver, ordering medications, tests, or procedures, communicating with other HCPs (not separately reported), independently interpreting results (not separately reported), communicating results to the patient/family/caregiver and care coordination (not separately reported).

## 2023-08-19 NOTE — Telephone Encounter (Signed)
 Per Verbal instructions from Dr. Alvin Axe : RV PRN

## 2023-08-19 NOTE — Telephone Encounter (Signed)
 Writer dropped off PET CT CD to [facility] radiology department with patient information attached, writer's phone extension [phone], and where to return the disc.

## 2023-08-19 NOTE — Patient Instructions (Signed)
 Rv TBD   Refer to navigation   Need to be presented in cancer conference

## 2023-08-20 NOTE — Telephone Encounter (Signed)
 Name: Linzy Alexandra  DOB: 03-16-1946  MRN: [id number]    Oncology Navigation- Initial Note:    Intake-  Contact Type: Telephone    Continuum of Care: Diagnosis/Active Treatment    Smoking hx: Former Smoker    Notes: Navigator spoke with pt. Introducing self and explaining to pt. How writer can be of assistance. Resouces offered. Films that are on disc pt. Obtained from [place]  cannot be accessed due to incorrect method of loading films to disc. Writer spoke with [facility] [place] [facility] [place] [phone] requesting they share films via powershare to [facility] in [place] . Writer then spoke with [facility] radiology HUB to try and retrieve films, but films not shared yet.   3:30 HUB closed and will F/U on MOnday.     Electronically signed by Michaelene Admire, RN on 08/20/2023 at 3:28 PM

## 2023-08-20 NOTE — Telephone Encounter (Signed)
 Received PET scan CD from [facility] radiology dept. Bobbye Burrow from radiology stated CD was unable to be uploaded due to being incompatible with our system. Pt, Dr. Charlane Cones, and Prudencio Browner, RN Navigator informed.

## 2023-08-23 NOTE — Telephone Encounter (Signed)
 Name: Lolamae Baumhover  DOB: Sep 28, 1945  MRN: [id number]    Oncology Navigation Follow-Up Note    Contact Type:  Telephone    Notes: navigator spoke with Radiology HUB and they cannot find films that were pushed. Writer then returning call to [facility] [place]  and was able to find [facility] in [place], but unable to find our location. Writer sending request to [facility] [place]  radiology for films and they will try and find our location.      Electronically signed by Michaelene Admire, RN on 08/23/2023 at 3:58 PM

## 2023-08-24 ENCOUNTER — Encounter

## 2023-08-24 NOTE — Telephone Encounter (Signed)
 Name: Diana Willis  DOB: 09/16/1945  MRN: [id number]    Oncology Navigation Follow-Up Note    Contact Type:  Telephone    Notes: Navigator spoke with [facility] in [place]  and they use [employer] and [employer], therefore unable to retrieve films. Dr. Alvin Axe aware and ordering CT chest w/contrast. Writer updated pt. And schedule CT for Friday. Writer then spoke with Marysue Sola at the Radiology HUB and will pick disc up tomorrow at [facility] and try and open it.   Writer spoke with MO office in [place] , Peterson Brandt faxing addendum to MRI Brain to Triage and Triage as well as Dr. Alvin Axe aware. Something of concern Parotid ?       Electronically signed by Michaelene Admire, RN on 08/24/2023 at 1:11 PM

## 2023-08-24 NOTE — Progress Notes (Signed)
 Name: Diana Willis  DOB: [birth year]  MRN: [id number]    Oncology Navigation Follow-Up Note    Contact Type:  Medical Oncology    Notes: Navigator spoke with [facility]iology [facility] and [facility] and unable to retrieve original films. Dr. Alvin Axe updated and writer asked to place order for CT chest with contrast.      Electronically signed by Michaelene Admire, RN on 08/24/2023 at 12:16 PM

## 2023-08-26 NOTE — Telephone Encounter (Signed)
 Name: Diana Willis  DOB: 06-Pecor-1947  MRN: [id number]    Oncology Navigation Follow-Up Note    Contact Type:  Telephone    Notes: Navigator spoke with both pt. And Radiolog HUB and Marysue Sola tech able to open film disc and will take to V's and have them upload to PACHS sometime today. Pt. Has CT scheduled for tomorrow, but will keep on schedule until confirmation is received that films can be viewed.       Electronically signed by Michaelene Admire, RN on 08/26/2023 at 9:17 AM

## 2023-08-27 ENCOUNTER — Inpatient Hospital Stay
Admit: 2023-08-27 | Discharge: 2023-08-27 | Payer: PRIVATE HEALTH INSURANCE | Attending: Hematology & Oncology | Primary: Family Medicine

## 2023-08-27 DIAGNOSIS — C3490 Malignant neoplasm of unspecified part of unspecified bronchus or lung: Secondary | ICD-10-CM

## 2023-08-27 LAB — POCT CREATININE: POC Creatinine: 1.3 mg/dL (ref 0.6–1.4)

## 2023-08-27 MED ORDER — SODIUM CHLORIDE 0.9 % IV BOLUS
0.9 | Freq: Once | INTRAVENOUS | Status: AC
Start: 2023-08-27 — End: 2023-08-27
  Administered 2023-08-27: 18:00:00 80 mL via INTRAVENOUS

## 2023-08-27 MED ORDER — NORMAL SALINE FLUSH 0.9 % IV SOLN
0.9 | Freq: Once | INTRAVENOUS | Status: AC
Start: 2023-08-27 — End: 2023-08-27
  Administered 2023-08-27: 18:00:00 10 mL via INTRAVENOUS

## 2023-08-27 MED ORDER — IOPAMIDOL 76 % IV SOLN
76 | Freq: Once | INTRAVENOUS | Status: AC | PRN
Start: 2023-08-27 — End: 2023-08-27
  Administered 2023-08-27: 18:00:00 100 mL via INTRAVENOUS

## 2023-08-30 NOTE — Telephone Encounter (Addendum)
 Name: Diana Willis  DOB: 1945/08/12  MRN: [id number]    Oncology Navigation Follow-Up Note    Contact Type:  Telephone    Notes: Navigator spoke with [place]  Hematology office and requesting pathology report as well as [employer] testing report. Writer needing pathology services in [place]  contact information if further testing desired. Staff will return to Clinical research associate with pathology information. Dr. Alvin Axe calling pt. With updates from recent scan.   [employer] results received and no mutations, Dr. Alvin Axe aware. Writer also received pathology report and both faxed to RO.      Electronically signed by Michaelene Admire, RN on 08/30/2023 at 3:12 PM

## 2023-08-30 NOTE — Telephone Encounter (Signed)
 Diana Willis called stated patient was has referral to [facility].  Upon chart review office never notified of referral referred to was blank when in chart.  Referral corrected and patient contacted for scheduling.  Patient scheduled 09/02/23 @3pm  for RO consult with Dr. Arnie Lao.

## 2023-09-02 ENCOUNTER — Inpatient Hospital Stay: Admit: 2023-09-02 | Discharge: 2023-09-07 | Payer: MEDICARE | Attending: Internal Medicine | Primary: Family Medicine

## 2023-09-02 VITALS — BP 112/72 | HR 87 | Temp 97.00000°F | Resp 16 | Wt 138.0 lb

## 2023-09-02 DIAGNOSIS — Z510 Encounter for antineoplastic radiation therapy: Secondary | ICD-10-CM

## 2023-09-02 DIAGNOSIS — C3411 Malignant neoplasm of upper lobe, right bronchus or lung: Secondary | ICD-10-CM

## 2023-09-02 NOTE — Consults (Signed)
 [facility]  [facility]            Radiation Oncology          [address]          [place], [place] [address]        Deanna Expose: [phone]        F: [phone]       [email or web address]             09/02/2023    Patient: Diana Willis   Date of Birth: Mar 24, 1946       Dear Dr Alvin Axe:    Thank you for referring Diana Willis to me for evaluation. Below are the relevant portions of my assessment and plan of care. If you have questions, please do not hesitate to call me. I look forward to following this patient along with you.     Sincerely,    Electronically signed by Randye Buttner, MD on 09/02/2023 at 9:55 AM    CC: Patient Care Team:  Dood, Mindi Alto, MD as PCP - General (Family Medicine)  Michaelene Admire, RN as Nurse Navigator (Oncology)  ------------------------------------------------------------------------------------------------------------------------------------------------------------------------------------------      RADIATION ONCOLOGY NEW PATIENT VISIT:    Date of Service: 09/02/2023    Location:  [facility],   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 female who had a CT abdomen to evaluate for abdominal hernia, incidentally a lesion was noted in the right lung suspicious for neoplastic process.  Patient underwent a biopsy of the suspicious lesion which was positive for adenocarcinoma.  She had staging with an FDG PET scan which showed FDG avid lesion in the right upper lobe measuring 1.8 cm in greatest dimension, no other evidence of metastatic disease was noted.  She also had an MRI of the brain which was negative for intracranial metastatic disease.  All patient's workup including imaging and biopsies were completed  in [place] .      PRIOR RADIATION HISTORY:  No prior history of radiation therapy    PACEMAKER:   None    PAST MEDICAL HISTORY:  History reviewed. No pertinent past medical history.    PAST SURGICAL HISTORY:  Past Surgical History:   Procedure Laterality Date    BLADDER SUSPENSION      HERNIA REPAIR      x 3    HYSTERECTOMY (CERVIX STATUS UNKNOWN)      OVARY REMOVAL      TONSILLECTOMY         MEDICATIONS:  No current outpatient medications on file.    ALLERGIES:   No Known Allergies    SOCIAL HISTORY:  Social History     Socioeconomic History    Marital status: Single     Spouse name: None    Number of children: None    Years of education: None    Highest education level: None   Tobacco Use    Smoking status: Former     Current packs/day: 0.00     Average packs/day: 0.5 packs/day for 50.0 years (25.0 ttl pk-yrs)     Types: Cigarettes     Quit date: 08/18/2023  Years since quitting: 0.0    Smokeless tobacco: Never   Substance and Sexual Activity    Alcohol use: Yes     Alcohol/week: 1.0 standard drink of alcohol     Types: 1 Glasses of wine per week       FAMILY HISTORY:  Family History   Problem Relation Age of Onset    Prostate Cancer Brother     Breast Cancer Maternal Aunt 13       REVIEW OF SYSTEMS:    A full 14 point review of systems was performed and assessed and found to be negative except as noted above.    PHYSICAL EXAMINATION:    CHAPERONE: Family/friend/companieon Present    ECOG:  0 Asymptomatic    VITAL SIGNS: BP 112/72   Pulse 87   Temp 97 F (36.1 C) (Temporal)   Resp 16   Wt 62.6 kg (138 lb)   SpO2 96%   BMI 26.95 kg/m   GENERAL:  General appearance is that of a well-nourished, well-developed in no apparent distress.  HEART:  Normal rate and regular rhythm  LUNGS:  Pulmonary effort normal.   ABDOMEN:  Soft, nontender, non distended  EXTREMITIES:  No edema.  No calf tenderness.  MSK:  No spinal tenderness. Normal ROM.  NEUROLOGICAL: Alert and oriented. Strength and sensation intact  bilaterally. No focal deficits.   PSYCH: Mood normal, behavior normal.        LABS:  WBC   Date Value Ref Range Status   10/26/2022 9.8 3.5 - 11.3 k/uL Final     Neutrophils Absolute   Date Value Ref Range Status   10/26/2022 6.64 1.50 - 8.10 k/uL Final     Hemoglobin   Date Value Ref Range Status   10/26/2022 15.8 (H) 11.9 - 15.1 g/dL Final     Platelets   Date Value Ref Range Status   10/26/2022 245 138 - 453 k/uL Final     IMAGING:   CT chest 08/27/2023:  IMPRESSION:  1. 1.8 x 1.5 x 1.8 cm spiculated nodule in the anterior segment right upper  lobe along the chest wall underlying the 4th rib. Consistent with lung cancer  as per history.  2. No additional lung nodules.  3. No adenopathy.  4. Small to moderate sliding hiatal hernia showing some interval enlargement  from 2017.    FDG PET scan completed in [place]  on 07/23/2022 demonstrates FDG avid lesion in the right upper lobe consistent with neoplastic process, uptake was noted in bilateral parotid gland, no other suspicious metastatic disease was noted    PATHOLOGY:        ASSESSMENT AND PLAN:   Diana Willis is a 78 y.o. female with a new diagnosis of adenocarcinoma of the right upper lobe cT1 N0 M0    Treatment options include surgical resection versus SBRT.  Explained to the patient that surgical resection is the standard of care if she is a candidate.  Patient expressed that she is not interested in surgery.  She was offered referral to cardiothoracic surgery but she declined.    Given patient has declined surgery, she is a candidate for SBRT to the right upper lobe lesion.  Radiation will be given with curative intent.  Radiation will be given every other day for total of 5 treatments.  She will come back for CT simulation for treatment planning purposes next week.    With regards to radiation to the lung, I discussed the possible short-term side effects of  skin irritation (causing redness, dryness, or peeling), tiredness, low blood counts (causing infection or  bleeding), inflammation of the lungs (causing shortness of breath and cough), trouble/pain with swallowing and hair loss in treated area, or hemoptysis.  Possible long-term side effects discussed included hyperpigmentation of the skin, scarring of the lungs (causing shortness of breath and cough), chronic dysphagia, damage to the nerves (causing numbness, weakness, or paralysis) and damage to the heart.  Informed consent was signed and the patient.    Patient was in agreement with my recommendations. All questions were answered to their satisfaction. Patient was advised to contact us  anytime should they have any questions or concerns.         Electronically signed by Randye Buttner, MD on 09/02/2023 at 9:55 AM      Drugs Prescribed:  New Prescriptions    No medications on file       Orders Placed:   No orders of the defined types were placed in this encounter.        CC:  Patient Care Team:  Dood, Mindi Alto, MD as PCP - General (Family Medicine)  Michaelene Admire, RN as Nurse Navigator (Oncology)         Total time spent on this case on the day of encounter is 60 minutes. This time includes combination of one or more of the following - review of necessary tests, review of pertinent medical records from the EMR, performing medically appropriate examination and evaluation, counseling and educating the patient/family/caregiver, ordering necessary medical tests, procedures etc., documenting the clinical information in the electronic medical record, care coordination, referring and communicating with other health care providers and interpretation of results independently. This note is created with the assistance of a speech recognition program.  While intending to generate a document that actually reflects the content of the visit, the document can still have some errors including those of syntax and sound a like substitutions which Mastrianni escape proof reading.  It such instances, actual meaning can be extrapolated by  contextual diversion.

## 2023-09-02 NOTE — Progress Notes (Signed)
 Referring Physician:  Dr. Alvin Axe      Vitals:    09/02/23 1445   BP: 112/72   Pulse: 87   Resp: 16   Temp: 97 F (36.1 C)   SpO2: 96%    :     Pain Assessment: 0-10  Pain Level: 0       Wt Readings from Last 1 Encounters:   09/02/23 62.6 kg (138 lb)        Body mass index is 26.95 kg/m.              Smoking Status:    []  Smoker - PPD:   [x]  Nonsmoker - Quit Date:     08/2023          []  Never a smoker      No chief complaint on file.       Cancer Staging   No matching staging information was found for the patient.      Prior Radiation Therapy? No   If yes, site treated:   Facility:                             Date:    Concurrent Chemo/radiation? No   If yes, start date:    Pacemaker/Defibrillator/ICD:  No    Do you have any musculoskeletal diseases, Lupus or arthritic conditions?  No   If yes, are you currently taking Methotrexate?  []   Yes  [x]   No                 No current outpatient medications on file.     No current facility-administered medications for this encounter.       No past medical history on file.    Past Surgical History:   Procedure Laterality Date    BLADDER SUSPENSION      HERNIA REPAIR      x 3    HYSTERECTOMY (CERVIX STATUS UNKNOWN)      OVARY REMOVAL      TONSILLECTOMY         Family History   Problem Relation Age of Onset    Prostate Cancer Brother     Breast Cancer Maternal Aunt 48       Social History     Socioeconomic History    Marital status: Single     Spouse name: Not on file    Number of children: Not on file    Years of education: Not on file    Highest education level: Not on file   Occupational History    Not on file   Tobacco Use    Smoking status: Former     Current packs/day: 0.00     Average packs/day: 0.5 packs/day for 50.0 years (25.0 ttl pk-yrs)     Types: Cigarettes     Quit date: 08/18/2023     Years since quitting: 0.0    Smokeless tobacco: Never   Substance and Sexual Activity    Alcohol use: Yes     Alcohol/week: 1.0 standard drink of alcohol     Types: 1 Glasses of  wine per week    Drug use: Not on file    Sexual activity: Not on file   Other Topics Concern    Not on file   Social History Narrative    Not on file     Social Drivers of Health     Financial Resource Strain: Not on file  Food Insecurity: Not on file   Transportation Needs: Not on file   Physical Activity: Not on file   Stress: Not on file   Social Connections: Not on file   Intimate Partner Violence: Not on file   Housing Stability: Not on file             FALLS RISK SCREEN  Instructions:  Assess the patient and enter the appropriate indicators that are present for fall risk identification.   Total the numbers entered and assign a fall risk score from Table 2.  Reassess patient at a minimum every 12 weeks or with status change.    Assessment   Date  09/02/2023     1.  Mental Ability: confusion/cognitively impaired 0     2.  Elimination Issues: incontinence, frequency 0       3.  Ambulatory: use of assistive devices (walker, cane, off-loading devices),        attached to equipment (IV pole, oxygen) 0     4.  Sensory Limitations: dizziness, vertigo, impaired vision 0     5.  Age less than 65        0     6.  Age 44 or greater 1     7.  Medication: diuretics, strong analgesics, hypnotics, sedatives,        antihypertensive agents 0   8.  Falls:  recent history of falls within the last 3 months (not to include slipping or        tripping) 0   TOTAL 1    If score of 4 or greater was education given? No       TABLE 2   Risk Score Risk Level Plan of Care   0-3 Little or  No Risk 1.  Provide assistance as indicated for ambulation activities  2.  Reorient confused/cognitively impaired patient  3.  Call-light/bell within patient's reach  4.  Chair/bed in low position, stretcher/bed with siderails up except when performing patient care activities  5.  Educate patient/family/caregiver on falls prevention  6.  Reassess in 12 weeks or with any noted change in patient condition which places them at a risk for a fall   4-6  Moderate Risk 1.  Provide assistance as indicated for ambulation activities  2.  Reorient confused/cognitively impaired patient  3.  Call-light/bell within patient's reach  4.  Chair/bed in low position, stretcher/bed with siderails up except when performing patient care activities  5.  Educate patient/family/caregiver on falls prevention     7 or   Higher High Risk 1.  Place patient in easily observable treatment room  2.  Patient attended at all times by family member or staff  3.  Provide assistance as indicated for ambulation activities  4.  Reorient confused/cognitively impaired patient  5.  Call-light/bell within patient's reach  6.  Chair/bed in low position, stretcher/bed with siderails up except when performing patient care activities  7.  Educate patient/family/caregiver on falls prevention             Assessment/Plan: Patient was seen today for consultation and arrives ambulatory with supportive daughter.  She states initial work up for her right lung cancer diagnosis started in [place]  where she lives during the winter.  States cancer was discovered while getting worked up for right hernia repair.  She denies pain or shortness of breath or cough.  She states she recently quit smoking.  She denies any significant medical history other than surgeries as  listed in her chart.     Dr. Arnie Lao making recommendation for SBRT lung radiation treatments.  Patient not as interested in surgery and states she was told by Dr. Alvin Axe to do radiation treatments.  She was called by Dr. Manual Self office for a consult appt and she declined this and states she is interested in pursuing radiation treatments.  Patient signed consent and scheduled simulation.     Patient Pain Score:  0            Mendel Stain, RN 09/02/2023 3:17 PM

## 2023-09-02 NOTE — ACP (Advance Care Planning) (Signed)
 Advance Care Planning     Hospice Services: Patient is not currently receiving hospice services/has not received hospice care within the performance year.    Advance Care Planning was discussed with patient, and she does not have ACP documents.  She was given a blank copy of these to review for as requested.

## 2023-09-03 NOTE — Telephone Encounter (Signed)
 Name: Minah Axelrod  DOB: [birth year]  MRN: [id number]    Oncology Navigation Follow-Up Note    Contact Type:  Telephone    Notes: Navigator will send folder once address confirmed.       Electronically signed by Michaelene Admire, RN on 09/03/2023 at 10:49 AM

## 2023-09-07 NOTE — Telephone Encounter (Signed)
 Name: Diana Willis  DOB: 1945-06-24  MRN: [id number]    Oncology Navigation Follow-Up Note    Contact Type:  Telephone    Notes: Navigator spoke with RO-Amanda and checking on previous films in PACHS and films are now in [facility]      Electronically signed by Michaelene Admire, RN on 09/07/2023 at 3:23 PM

## 2023-09-09 ENCOUNTER — Inpatient Hospital Stay: Admit: 2023-09-09 | Payer: MEDICARE | Primary: Family Medicine

## 2023-09-09 ENCOUNTER — Inpatient Hospital Stay: Admit: 2023-09-09 | Discharge: 2023-10-07 | Payer: MEDICARE | Attending: Internal Medicine | Primary: Family Medicine

## 2023-09-09 NOTE — Progress Notes (Signed)
 Radiation Treatment Site/Plan/Fractions:5 every other day SBRT treatments to right lung.      Concurrent Chemotherapy/Immunotherapy:No      Cardiac Device:No      Transportation Concerns:No      Nursing Referrals and Reasons:None      Contrast Given:No          Miscellaneous Information:Patient arrives ambulatory with steady gait.  Radiation therapy plan of care, treatment process and site specific side effects reviewed.  Questions answered to her satisfaction and she voiced understanding of the information.  She was escorted to simulation room.

## 2023-09-09 NOTE — Discharge Instructions (Signed)
 SBRT Education   Simulation CT scan: During this scan a mold and markings/tattoos will be created to assist in proper positioning for your daily treatments.     Planning: Once the simulation scan is complete it could take up to 2 weeks for your treatment plan to be completed.     Treatments: Typically, SBRT is given over 5 treatments in a 2-3 week period. This may vary depending on your doctor's recommendations.     Side Effects: When you receive SBRT only a small area of your body is exposed to radiation. This means that SBRT usually causes fewer side effects than other types of radiation therapy.     Cough or shortness of breath    You may develop a cough or shortness of breath after your treatment is completed. Call your doctor or nurse if you develop these symptoms or if they become worse.  Below are suggestions to help you feel more comfortable if you have a cough or shortness of breath.  Don't smoke. Smoking irritates the lining of your airway and causes more coughing. If you'd like help to stop smoking, notify our nurse.   Use 1 or 2 pillows to prop up your upper body while you sleep.  Use a humidifier while you sleep. Be sure to change the water and clean the humidifier often. Follow the manufacturer's instructions.  Drinknig water or sucking on sugarless candies may help with your cough.         Skin and hair reactions    Most people getting SBRT don't have any skin changes during treatment. You may notice skin changes 4 to 6 weeks after you finish treatment.  Your skin may become pink or tanned on the front or back of the area being treated. Your nurse will teach you how to care for your skin during your treatment.  You may lose some or all of the hair in the area being treated. Your hair will usually grow back 3 to 6 months after your treatment is completed.  Below are guidelines to help you care for your skin during treatment. Follow these guidelines until your skin gets better. These guidelines refer  only to the skin in the area being treated with radiation.  Keep your skin clean  Bathe or shower daily using lukewarm water and a mild unscented soap. Rinse your skin well and pat it dry with a soft towel.  When washing, be gentle with your skin in the area being treated. Don't use a washcloth, scrubbing cloth, or brush.  The tattoo marks you received before your treatment are permanent and won't wash off. You may get other markings during treatment, such as an outline of your treatment area with a purple felt-tipped marker. You can remove these markings with mineral oil when your radiation therapists say it's okay.  Don't use alcohol or alcohol pads on your skin in the area being treated.  Moisturize your skin often  You should start using a moisturizer on the first day of your treatment. If you aren't likely to develop a skin reaction, you don't need to use a moisturizer unless your skin becomes dry or itchy. You can use any over-the-counter (not prescription) moisturizer similar to Aquaphor/Eucerin or Miaderm.   If you're using a moisturizer, apply it 2 times a day.  Avoid irritating your skin in the area being treated  Wear loose-fitting cotton clothing in the area being treated. Don't wear tight clothing that will rub against your skin.  Use  only the moisturizers, creams, or lotions that your doctor or nurse recommends.  Don't use makeup, perfumes, powders, or aftershave in the area being treated.  If your skin is itchy, don't scratch it. Apply moisturizer. Ask your nurse for recommendations on how to relieve the itching.  Don't shave in the area that's being treated. If you must shave, use only an Neurosurgeon. Stop if your skin becomes irritated.  Don't put tape on your treated skin.  Don't let your treated skin come into contact with extreme hot or cold temperatures. This includes hot tubs, water bottles, heating pads, and ice packs.  If you don't have any skin reactions during your treatment, you can  swim in a chlorinated pool. However, be sure to rinse off the chlorine right after getting out of the pool.  Avoid tanning or burning your skin during treatment and for the rest of your life. If you're going to be in the sun, use a PABA-free sunblock with an SPF of 30 or higher. Also, wear loose-fitting clothing that covers you as much as possible.      Fatigue    Fatigue is feeling tired or weak, not wanting to do things, not being able to concentrate, or feeling slowed down. You may develop fatigue 4 to 6 weeks after you finish treatment. The fatigue can range from mild to severe. It may last for several months after your treatment ends.  You may find that your fatigue is worse at certain times of the day. Below are suggestions to help you manage your fatigue.  Ways to manage fatigue  If you're working and are feeling well, continue to do so.   Plan your daily activities. Pick the things that are necessary and most important to you and do them when you have the most energy. For example, you may go to work but not do housework, or watch your child's sports event but not go out to dinner.  Try to sleep at least 8 hours every night. This may be more sleep than you needed before you started radiation therapy. You may also find it helpful to go to sleep earlier at night and get up later in the morning. One way to sleep better at night is to be active during the day. For example, if you're able to exercise, you could go for a walk, do yoga, or ride a bike. Another way to sleep better at night is to relax before going to bed. You might read a book, work on a jigsaw puzzle, listen to music, or do calming hobbies.  Some people have more energy when they exercise. Ask your doctor if you can do light exercise, such as walking, stretching, or yoga.  Eat foods and drink liquids that are high in protein and calories.   Other symptoms, such as pain, nausea, diarrhea, difficulty sleeping, or feeling depressed or anxious, can  increase your fatigue. Ask your radiation oncologist or nurse for help with any other symptoms you may have.

## 2023-09-10 NOTE — Telephone Encounter (Signed)
 Name: Diana Willis  DOB: [birth year]  MRN: [id number]    Oncology Navigation Follow-Up Note    Contact Type:  Telephone    Notes: Navigator spoke with pt. Offering assistance. Navigation packet along with radiology disc sent to pt.       Electronically signed by Michaelene Admire, RN on 09/10/2023 at 3:05 PM

## 2023-09-14 ENCOUNTER — Inpatient Hospital Stay: Admit: 2023-09-14 | Discharge: 2023-09-14 | Payer: MEDICARE | Primary: Family Medicine

## 2023-09-14 DIAGNOSIS — Z510 Encounter for antineoplastic radiation therapy: Secondary | ICD-10-CM

## 2023-09-14 DIAGNOSIS — C3411 Malignant neoplasm of upper lobe, right bronchus or lung: Principal | ICD-10-CM

## 2023-09-14 NOTE — Telephone Encounter (Signed)
LVM for patient in regards to a referral to schedule consult visit, 1st attempt.

## 2023-10-04 ENCOUNTER — Inpatient Hospital Stay: Admit: 2023-10-04 | Discharge: 2023-10-04 | Payer: MEDICARE | Attending: Internal Medicine

## 2023-10-04 NOTE — Telephone Encounter (Signed)
 Name: Diana Willis  DOB: 21-May-1945  MRN: [id number]    Oncology Navigation Follow-Up Note    Contact Type:  Telephone    Notes: Navigator spoke with pt. And started RTX today, no barriers to care noted.       Electronically signed by Doyce Ewings, RN on 10/04/2023 at 3:45 PM

## 2023-10-06 ENCOUNTER — Inpatient Hospital Stay: Admit: 2023-10-06 | Discharge: 2023-10-06 | Payer: MEDICARE | Attending: Internal Medicine

## 2023-10-06 ENCOUNTER — Inpatient Hospital Stay: Admit: 2023-10-08 | Payer: MEDICARE | Attending: Internal Medicine

## 2023-10-06 DIAGNOSIS — C3411 Malignant neoplasm of upper lobe, right bronchus or lung: Principal | ICD-10-CM

## 2023-10-06 NOTE — Progress Notes (Signed)
 Diana Willis  10/08/2023  Wt Readings from Last 3 Encounters:   10/08/23 62.2 kg (137 lb 1.6 oz)   09/02/23 62.6 kg (138 lb)   08/19/23 61.8 kg (136 lb 3.2 oz)     Body mass index is 26.78 kg/m.        Treatment Area:RUL lung    Patient was seen today for weekly visit.      Comfort Alteration  Fatigue: None    Ventilation Alterations  Cough: No  Hemoptysis: No  Mucus Color: none  Dyspnea: No      Nutritional Alteration  Anorexia: No  Nausea: No   Vomiting: No     Mucous Membrane Alteration  Voice Changes/ Stridor/Larynx: no  Pharynx & Esophagus: intact    Elimination Alterations  Constipation: no  Diarrhea:  no    Skin Alteration   Sensation:intact    Radiation Dermatitis:  Intact [x]      Erythema  []      Discoloration  []      Rash []      Dry desquamation  []      Moist desquamation []        Emotional  Coping: effective      Injury, potential bleeding or infection: none    Lab Results   Component Value Date    WBC 9.8 10/26/2022    HGB 15.8 (H) 10/26/2022    HCT 49.2 (H) 10/26/2022    PLT 245 10/26/2022         BP (!) 93/57   Pulse 86   Temp 98.2 F (36.8 C) (Temporal)   Resp 16   Wt 62.2 kg (137 lb 1.6 oz)   SpO2 96%   BMI 26.78 kg/m      Pain Assessment: None - Denies Pain            Assessment/Plan: Patient was seen today for weekly visit.  She arrives ambulatory with steady gait.  She denies bothersome respiratory complaints.  She denies treatment related concerns.  Dr. Edris evaluated patient.  Once radiation treatment complete, she will follow up in 3 months with CT scan chest.    Adrien Gull, RN

## 2023-10-06 NOTE — Progress Notes (Signed)
 [facility]  [facility]       Radiation Oncology          [address]          [place], [place] [address]        MALVA: [phone]        F: [phone]       [email or web address]             RADIATION ONCOLOGY WEEKLY PROGRESS NOTE  Patient ID:   Diana Willis  DOB: 1946-01-07   MRN: [id number]    Location:  [facility] Radiation Oncology,   [address] [place], [place]  [address]   [phone]    DIAGNOSIS:  NSCLC of RUL T1N0M0      TREATMENT DETAILS:  Treatment Site: RUL   Actual Dose: 3000cGy  Total Planned Dose: 5000cGy  Treatment Technique: SBRT  Fraction Technique: Every other day  Therapy imaging monitoring: CBCT daily  Concurrent Chemotherapy: None    SUBJECTIVE:   Patient seen for their weekly on treatment evaluation today.  Doing well, denies pain in the treated area, denies changes in breathing    OBJECTIVE:     ECOG:  0 Asymptomatic    VITAL SIGNS: BP (!) 93/57   Pulse 86   Temp 98.2 F (36.8 C) (Temporal)   Resp 16   Wt 62.2 kg (137 lb 1.6 oz)   SpO2 96%   BMI 26.78 kg/m   Wt Readings from Last 5 Encounters:   10/08/23 62.2 kg (137 lb 1.6 oz)   09/02/23 62.6 kg (138 lb)   08/19/23 61.8 kg (136 lb 3.2 oz)   12/04/22 59 kg (130 lb)   02/08/19 62.6 kg (138 lb)     GENERAL:  General appearance is that of a well-nourished, well-developed in no apparent distress.  HEART:  Normal rate and regular rhythm  LUNGS:  Pulmonary effort normal.  ABDOMEN:  Soft, nontender, non distended  EXTREMITIES:  No edema.  No calf tenderness.  MSK:  No spinal tenderness. Normal ROM.  NEUROLOGICAL: Alert and oriented. Strength and sensation intact bilaterally. No focal deficits.   PSYCH: Mood normal, behavior normal.      LABS:  WBC   Date Value Ref Range Status   10/26/2022 9.8 3.5 - 11.3 k/uL Final     Neutrophils Absolute   Date Value Ref Range Status   10/26/2022 6.64 1.50 - 8.10 k/uL Final     Hemoglobin   Date Value Ref Range Status   10/26/2022 15.8 (H) 11.9 - 15.1 g/dL Final     Platelets    Date Value Ref Range Status   10/26/2022 245 138 - 453 k/uL Final     Creatinine   Date Value Ref Range Status   10/26/2022 1.0 (H) 0.50 - 0.90 mg/dL Final     POC Creatinine   Date Value Ref Range Status   08/27/2023 1.3 0.6 - 1.4 mg/dL Final       MEDICATIONS:  No current outpatient medications on file.      ASSESSMENT PLAN:   Treatment setup and plan reviewed. Port images/CBCT images reviewed. Appropriate laboratory work was reviewed. Treatment side effects and toxicities reviewed with the patient, and appropriate management was advised. Will continue radiation treatment as planned, and recommend patient contact us  if they have any questions or concerns.     Continue SBRT as planned.  Posttreatment CT chest in 3 months, return to the radiation clinic afterwards.    Electronically signed  by Norval Coward, MD on 10/08/2023 at 10:41 AM      Drugs Prescribed:  New Prescriptions    No medications on file       Other Orders Placed:  Orders Placed This Encounter   Procedures    CT CHEST W CONTRAST

## 2023-10-08 ENCOUNTER — Inpatient Hospital Stay: Admit: 2023-10-08 | Discharge: 2023-10-08 | Payer: MEDICARE | Attending: Internal Medicine

## 2023-10-08 VITALS — BP 93/57 | HR 86 | Temp 98.20000°F | Resp 16 | Wt 137.1 lb

## 2023-10-11 ENCOUNTER — Inpatient Hospital Stay: Admit: 2023-10-11 | Discharge: 2023-10-11 | Payer: MEDICARE | Attending: Radiation Oncology

## 2023-10-12 NOTE — Telephone Encounter (Signed)
 Name: Madhuri Vacca  DOB: 1945/07/13  MRN: [id number]    Oncology Navigation Follow-Up Note    Contact Type:  Telephone    Notes: Please schedule pt. For MO F/U in 3-4 weeks and will plan to discuss hernia surgery then      Electronically signed by Doyce Ewings, RN on 10/12/2023 at 11:34 AM

## 2023-10-12 NOTE — Telephone Encounter (Signed)
 Name: Diana Willis  DOB: [birth year]  MRN: [id number]    Oncology Navigation Follow-Up Note    Contact Type:  Telephone    Notes: Navigator received call from pt. And RTX final Tx tomorrow. Pt. Inquiring about a hernia repair?  Dr. Linwood when do want to see pt.? Pt. Needing surgical referral for hernia repair and more than likely will need released for surgery. RO following up with scan 9/29.      Electronically signed by Doyce Ewings, RN on 10/12/2023 at 8:35 AM

## 2023-10-13 ENCOUNTER — Inpatient Hospital Stay: Admit: 2023-10-13 | Discharge: 2023-10-13 | Payer: MEDICARE | Attending: Radiation Oncology

## 2023-10-13 DIAGNOSIS — C3411 Malignant neoplasm of upper lobe, right bronchus or lung: Principal | ICD-10-CM

## 2023-10-18 ENCOUNTER — Encounter

## 2023-10-18 NOTE — Progress Notes (Signed)
 [facility]  [facility]            Radiation Oncology          [address]          [place], [place] [address]        MALVA: [phone]        F: [phone]       [email or web address]           Dear Dr No ref. provider found:    Thank you for referring Diana Willis to me for evaluation and treatment. Below is a summary of the patient's recently completed radiation course.  If you have questions, please do not hesitate to call me. I look forward to following this patient along with you.    Sincerely,  Electronically signed by Norval Coward, MD on 10/18/2023 at 7:53 AM EDT      CC: Patient Care Team:  Dood, Elspeth Holland, MD as PCP - General (Family Medicine)  Catarina Junker, RN as Nurse Navigator (Oncology)  ------------------------------------------------------------------------------------------------------------------------------------------------------------------------------------------        Date of Service: 10/18/2023     Location:  [facility] Radiation Oncology,   [address] [place], [place]  [address]   [phone]        RADIATION ONCOLOGY END OF TREATMENT SUMMARY:    Patient ID:   Diana Willis  DOB: 1945/06/28   MRN: [id number]    DIAGNOSIS:  NSCLC of RUL T1 N0 M0      TREATMENT DETAILS:  Treatment Site: RUL   Actual Dose: 5000cGy  Total Planned Dose: 5000cGy  Treatment Technique: SBRT  Fraction Technique: Every other day  Therapy imaging monitoring: CBCT daily  Concurrent Chemotherapy: None  Total number of fractions: 5  Treatment dates: 10/04/2023 to 10/13/2023    CLINICAL COURSE:    ECOG 0    Patient completed her course of radiation therapy as prescribed and tolerated therapy well without significant toxicities reported.  She will have her first posttreatment CT chest in 3 months and will return to the radiation clinic afterwards.  Advised patient to continue posttreatment surveillance with medical oncology as well.    Electronically signed by Norval Coward, MD on 10/18/2023 at 7:53  AM

## 2023-11-09 ENCOUNTER — Ambulatory Visit: Admit: 2023-11-09 | Discharge: 2023-11-09 | Payer: MEDICARE | Attending: Hematology & Oncology

## 2023-11-09 VITALS — BP 101/71 | HR 66 | Temp 98.00000°F | Resp 18 | Wt 137.2 lb

## 2023-11-09 DIAGNOSIS — C3490 Malignant neoplasm of unspecified part of unspecified bronchus or lung: Principal | ICD-10-CM

## 2023-11-09 NOTE — Progress Notes (Signed)
 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  CURRENT THERAPY:  SBRT  BRIEF CASE HISTORY:   Diana Willis is a very pleasant 78 y.o. female who is referred to us  for management recommendation regarding her recently diagnosed right upper lobe lung cancer.  She had a ventral abdominal hernia and as part of the workup in preparation for surgery, she had a CT scan that showed right upper lobe lung mass.  There was some question about the size of the mass but ultimately it was determined that it is 1.9 x 1.6 cm.  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  All that was done in [place]  and we are trying to obtain records to confirm especially the pathology.  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  She just quit smoking yesterday.  She decided to move to [place]  to be treated for years..   She is sent to us  for a consultation, she is in good health and she does not take any medication.  Performance status is excellent, she playsPickle ball almost daily and she is able to walk without any limitation.  I would estimate her performance status ECOG0.  Workup showed no evidence of nodal or systemic metastases.  INTERIM HISTORY  Patient underwent SBRT, did quite well.  She will come back for follow-up.  SBRT completed early July/2025  PAST MEDICAL HISTORY: has no past medical history on file.    PAST SURGICAL HISTORY: has a past surgical history that includes Hysterectomy; Ovary removal; Tonsillectomy; bladder suspension; and hernia repair.     CURRENT MEDICATIONS:  currently has no medications in their medication list.    ALLERGIES:  has no known allergies.    FAMILY HISTORY: Negative for any hematological or oncological conditions.     SOCIAL HISTORY:  reports that she quit smoking about 2 months ago. Her smoking use included cigarettes. She has a 25 pack-year smoking history. She has never used smokeless tobacco. She reports current alcohol use of about 1.0 standard drink of  alcohol per week.    REVIEW OF SYSTEMS:   General: no fever or night sweats, Weight is stable.  ENT: No double or blurred vision, no tinnitus or hearing problem, no dysphagia or sore throat   Respiratory: No chest pain, no shortness of breath, no cough or hemoptysis.  Cardiovascular: Denies chest pain, PND or orthopnea. No L E swelling or palpitations.   Gastrointestinal:    No nausea or vomiting, abdominal pain, diarrhea or constipation.    Genitourinary: Denies dysuria, hematuria, frequency, urgency or incontinence.    Neurological: Denies headaches, decreased LOC, no sensory or motor focal deficits.   Musculoskeletal:  No arthralgia no back pain or joint swelling.   Skin: There are no rashes or bleeding.  Psychiatric:  No anxiety, no depression.   Endocrine: no diabetes or thyroid disease.   Hematologic: no bleeding , no adenopathy.    PHYSICAL EXAM: Shows a well appearing 78 y.o.-year-old female who is not in pain or distress. Vital Signs: Blood pressure 101/71, pulse 66, temperature 98 F (36.7 C), resp. rate 18, weight 62.2 kg (137 lb 3.2 oz), SpO2 96%. HEENT: Normocephalic and atraumatic. Pupils are equal, round, reactive to light and accommodation. Extraocular muscles are intact. Neck: Showed no JVD, no carotid bruit . Lungs: Clear to auscultation bilaterally. Heart: Regular without any murmur. Abdomen: Soft, nontender. No hepatosplenomegaly.  Ventral hernia extremities: Lower extremities show no edema, clubbing, or cyanosis.  Breasts: Examination not done today. Neuro exam: intact cranial nerves bilaterally no motor or sensory deficit, gait is normal. Lymphatic: no adenopathy appreciated in the supraclavicular, axillary, cervical or inguinal area    REVIEW OF LABORATORY DATA:     REVIEW OF RADIOLOGICAL RESULTS:     IMPRESSION:   Right upper lobe lung cancer, clinical stage T1 N0 M0  Status post SBRT, excellent results  PLAN:   The patient has stage I disease and underwent SBRT  She will need to undergo  standard surveillance.  The patient will continue to follow with radiation oncology while she is here but she is planning to move back to [place]  in the near future  We will be happy to answer any question or concern  Diagnosis, staging and treatment were reviewed.  Chances of relapse were discussed.    Emery Al-Nsour,MD  Hematologist/Medical Oncologist  [facility] hematology oncology physicians

## 2023-11-09 NOTE — Progress Notes (Unsigned)
 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  CURRENT THERAPY:  Workup in progress  BRIEF CASE HISTORY:   Diana Willis is a very pleasant 78 y.o. female who is referred to us  for management recommendation regarding her recently diagnosed right upper lobe lung cancer.  She had a ventral abdominal hernia and as part of the workup in preparation for surgery, she had a CT scan that showed right upper lobe lung mass.  There was some question about the size of the mass but ultimately it was determined that it is 1.9 x 1.6 cm.  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  All that was done in [place]  and we are trying to obtain records to confirm especially the pathology.  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  She just quit smoking yesterday.  She decided to move to [place]  to be treated for years..   She is sent to us  for a consultation, she is in good health and she does not take any medication.  Performance status is excellent, she playsPickle ball almost daily and she is able to walk without any limitation.  I would estimate her performance status ECOG0.        PAST MEDICAL HISTORY: has no past medical history on file.    PAST SURGICAL HISTORY: has a past surgical history that includes Hysterectomy; Ovary removal; Tonsillectomy; bladder suspension; and hernia repair.     CURRENT MEDICATIONS:  currently has no medications in their medication list.    ALLERGIES:  has no known allergies.    FAMILY HISTORY: Negative for any hematological or oncological conditions.     SOCIAL HISTORY:  reports that she quit smoking about 2 months ago. Her smoking use included cigarettes. She has a 25 pack-year smoking history. She has never used smokeless tobacco. She reports current alcohol use of about 1.0 standard drink of alcohol per week.    REVIEW OF SYSTEMS:   General: no fever or night sweats, Weight is stable.  ENT: No double or blurred vision, no tinnitus or hearing problem, no  dysphagia or sore throat   Respiratory: No chest pain, no shortness of breath, no cough or hemoptysis.  Cardiovascular: Denies chest pain, PND or orthopnea. No L E swelling or palpitations.   Gastrointestinal:    No nausea or vomiting, abdominal pain, diarrhea or constipation.    Genitourinary: Denies dysuria, hematuria, frequency, urgency or incontinence.    Neurological: Denies headaches, decreased LOC, no sensory or motor focal deficits.   Musculoskeletal:  No arthralgia no back pain or joint swelling.   Skin: There are no rashes or bleeding.  Psychiatric:  No anxiety, no depression.   Endocrine: no diabetes or thyroid disease.   Hematologic: no bleeding , no adenopathy.    PHYSICAL EXAM: Shows a well appearing 78 y.o.-year-old female who is not in pain or distress. Vital Signs: Blood pressure 101/71, pulse 66, temperature 98 F (36.7 C), resp. rate 18, weight 62.2 kg (137 lb 3.2 oz), SpO2 96%. HEENT: Normocephalic and atraumatic. Pupils are equal, round, reactive to light and accommodation. Extraocular muscles are intact. Neck: Showed no JVD, no carotid bruit . Lungs: Clear to auscultation bilaterally. Heart: Regular without any murmur. Abdomen: Soft, nontender. No hepatosplenomegaly.  Ventral hernia extremities: Lower extremities show no edema, clubbing, or cyanosis. Breasts: Examination not done today. Neuro exam: intact cranial nerves bilaterally no motor or sensory deficit, gait is normal. Lymphatic: no adenopathy appreciated in  the supraclavicular, axillary, cervical or inguinal area    REVIEW OF LABORATORY DATA:     REVIEW OF RADIOLOGICAL RESULTS:     IMPRESSION:   Right upper lobe lung cancer, clinical stage T1 N0 M0  PLAN:   I had a long discussion with the patient.  Records from [place]  were reviewed in details.  We will obtain the records especially the imaging studies and pathology and review them.  We will present the case in the cancer conference.  We discussed that treatment for early-stage lung  cancer which is likely surgery versus radiation.  The patient is clearly prefers radiation if possible.  Will present the case at the cancer conference and will discuss that with the thoracic surgeon and radiation oncologist.  The patient made quit smoking yesterday and was encouraged to stay quit  Patient had multiple questions that were answered to her her satisfaction    Emery Al-Nsour,MD  Hematologist/Medical Oncologist  [facility] hematology oncology physicians    I spent a total of 60 minutes on the date of the service which included preparing to see the patient, face-to-face patient care, completing clinical documentation, obtaining and/or reviewing separately obtained history, performing a medically appropriate examination, counseling and educating the patient/family/caregiver, ordering medications, tests, or procedures, communicating with other HCPs (not separately reported), independently interpreting results (not separately reported), communicating results to the patient/family/caregiver and care coordination (not separately reported).

## 2023-11-09 NOTE — Patient Instructions (Signed)
 Rv PRN

## 2023-11-25 ENCOUNTER — Ambulatory Visit: Admit: 2023-11-25 | Discharge: 2023-11-25 | Payer: MEDICARE | Attending: Registered Nurse

## 2023-11-25 DIAGNOSIS — K439 Ventral hernia without obstruction or gangrene: Principal | ICD-10-CM

## 2023-11-25 NOTE — Progress Notes (Signed)
 [facility] General Surgery   Newman EMERSON Mulch, MD, FACS  Freddy HERO. Abran, APRN-CNP  [address]  [place] , [place] [address]  P: [phone], F: [phone]    General and Robotic Surgery  Consult Note               PATIENT NAME: Diana Willis   DOB:  1946/01/05   MRN: [id number]   PCP:  Zoraida Lapine, APRN - NP     TODAY'S DATE: 11/25/2023    Chief Complaint   Patient presents with    New Patient     Pt here today as a new patient to discuss concerns of an abdominal hernia. Pt states she has noticed it has increased in size. Pt states she has had this for about a year. Pt states she has noted some pain a few times with this.        History of Present Illness  The patient is a 78 year old female who presents for an abdominal hernia.    She has been living with an abdominal hernia for approximately 1.5 years, which has been gradually increasing in size and occasionally causing discomfort. The hernia is located to the right of her midline upper abdomen. She reports no rectal bleeding or changes in bowel habits.     Her past surgical history includes three hernia repairs in the same area, initially performed laparoscopically, which were unsuccessful, leading to a third open surgery with mesh placement by Dr. Joni at [facility] approximately 15 years ago. This procedure required a week-long hospital stay due to complications, and she had a wound VAC placed around her belly button post-surgery. Additionally, she has undergone a hysterectomy, bladder suspension, ovary removal, and vaginal prolapse repair. She retains her gallbladder and appendix.    She has a history of non-small cell lung cancer, diagnosed as a small nodule on the right side near her rib. She underwent radiation therapy for 5 days in June 2025. She will have a CT scan at the end of September 2025 and an appointment with the radiologist on 01/20/2024.    Her primary care physician is D. Perrin, APRN-CNP, located in [place] . She is not on  any blood thinners, aspirin, ibuprofen, Aleve, or Motrin.    PAST SURGICAL HISTORY:  - Three hernia repairs (two laparoscopic, one open with mesh)  - Hysterectomy  - Bladder suspension  - Ovary removal  - Vaginal prolapse repair  - Wound VAC placement around the belly button post-hernia repair    SOCIAL HISTORY  Marital Status: Married  Occupations: Nursing assistant    FAMILY HISTORY  - Brother: Cancer (type unknown)     CT Result (most recent):  CT CHEST W CONTRAST 08/27/2023    Narrative  EXAMINATION:  CT OF THE CHEST WITH CONTRAST 08/27/2023 1:25 pm    TECHNIQUE:  CT of the chest was performed with the administration of intravenous  contrast. Multiplanar reformatted images are provided for review. Automated  exposure control, iterative reconstruction, and/or weight based adjustment of  the mA/kV was utilized to reduce the radiation dose to as low as reasonably  achievable.    COMPARISON:  CT chest 08/15/2015 from [facility].    HISTORY:  ORDERING SYSTEM PROVIDED HISTORY: Non-small cell lung cancer, unspecified  laterality (HCC)  TECHNOLOGIST PROVIDED HISTORY:    STAT Creatinine as needed:->Yes  Staging for lung cancer  Reason for Exam: Pt states follow up from lung CA, staging for lung CA, no  chest pain or SOB    FINDINGS:  Mediastinum: Cardiac size normal.  No pericardial effusion.  Aortic valve  repair noted.  Atherosclerotic disease evident.  A few subcentimeter  scattered mediastinal lymph nodes are noted.  For example 6 mm short axis  lymph node anterior to the right mainstem bronchus and similar size anterior  to the left mainstem bronchus at the level of carina.  No significant change.    12 mm rim left thyroid lobe nodule.  No follow-up recommended.  Small to  moderate size sliding hiatal hernia.  Also present in 2017 although now  larger.    Lungs/pleura: There is mild centrilobular emphysema.  In the anterior segment  right upper lobe there is pleural-based spiculated nodule along  chest wall  underlying the 4th rib.  This measures 1.8 x 1.5 x 1.8 cm.  Consistent with  lung cancer as per history provided.  No additional lung nodules.    Mild scarring in the medial basal right lower lobe.  No pleural effusion or  pneumothorax.    Upper Abdomen: No acute process.  No suspicious mass.    Soft Tissues/Bones: Degenerative changes in the bones.  No aggressive lytic  or blastic lesion.    Impression  1. 1.8 x 1.5 x 1.8 cm spiculated nodule in the anterior segment right upper  lobe along the chest wall underlying the 4th rib. Consistent with lung cancer  as per history.  2. No additional lung nodules.  3. No adenopathy.  4. Small to moderate sliding hiatal hernia showing some interval enlargement  from 2017.      PAST MEDICAL HISTORY     Past Medical History:   Diagnosis Date    Non-small cell lung cancer (HCC)        PROBLEM LIST     Patient Active Problem List   Diagnosis    Umbilical hernia    Mixed hyperlipidemia    Lung mass    Female cystocele    Tobacco user    Current smoker       SURGICAL HISTORY       Past Surgical History:   Procedure Laterality Date    BLADDER SUSPENSION      HERNIA REPAIR      x 3    HYSTERECTOMY (CERVIX STATUS UNKNOWN)      OVARY REMOVAL      TONSILLECTOMY         ALLERGIES   No Known Allergies    FAMILY HISTORY   family history includes Breast Cancer (age of onset: 73) in her maternal aunt; Prostate Cancer in her brother.    SOCIAL HISTORY    reports that she quit smoking about 3 months ago. Her smoking use included cigarettes. She has a 25 pack-year smoking history. She has never used smokeless tobacco. She reports current alcohol use of about 1.0 standard drink of alcohol per week.    MEDICATIONS   No current outpatient medications on file.     Review of Systems   Constitutional:  Negative for chills and fever.   HENT:  Negative for congestion, rhinorrhea and sore throat.    Respiratory:  Negative for cough and shortness of breath.    Cardiovascular:  Negative for  chest pain.   Gastrointestinal:         See HPI.   Genitourinary: Negative.    Skin: Negative.        VITALS:  BP 124/84 (BP Site: Left Upper Arm, Patient Position:  Sitting, BP Cuff Size: Small Adult)   Pulse 74   Ht 1.524 m (5')   Wt 61.8 kg (136 lb 4.8 oz)   SpO2 98%   BMI 26.62 kg/m     Physical Exam  Gastrointestinal: A large ventral hernia is present just to right of the midline of the upper abdomen. It is soft and reducible, normal overlying skin. The scar on the abdomen is well healed.        Physical Exam  Vitals reviewed.   Constitutional:       Appearance: She is not ill-appearing or toxic-appearing.   HENT:      Head: Normocephalic and atraumatic.      Right Ear: External ear normal.      Left Ear: External ear normal.      Nose: Nose normal.   Eyes:      General:         Right eye: No discharge.         Left eye: No discharge.      Conjunctiva/sclera: Conjunctivae normal.   Neck:      Trachea: No tracheal deviation.   Cardiovascular:      Rate and Rhythm: Normal rate.   Pulmonary:      Effort: No accessory muscle usage or respiratory distress.   Abdominal:      General: Bowel sounds are normal. There is no distension.      Palpations: Abdomen is soft.      Tenderness: There is no abdominal tenderness.      Hernia: A hernia is present. Hernia is present in the ventral area.   Skin:     General: Skin is warm and dry.   Neurological:      Mental Status: She is alert and oriented to person, place, and time.   Psychiatric:         Mood and Affect: Mood normal.         Speech: Speech normal.         Behavior: Behavior normal.             Data  Lab Results   Component Value Date    WBC 9.8 10/26/2022    HGB 15.8 (H) 10/26/2022    HCT 49.2 (H) 10/26/2022    MCV 92.5 10/26/2022    PLT 245 10/26/2022     Lab Results   Component Value Date    NA 141 10/26/2022    K 4.7 10/26/2022    CL 103 10/26/2022    CO2 28 10/26/2022    BUN 23 10/26/2022    CREATININE 1.3 08/27/2023    GLUCOSE 89 10/26/2022    CALCIUM 9.2  10/26/2022    BILITOT 0.4 10/26/2022    ALKPHOS 96 10/26/2022    AST 14 10/26/2022    ALT 13 10/26/2022    LABGLOM 62 10/26/2022           Radiology Review:    CT Result (most recent):  CT CHEST W CONTRAST 08/27/2023    Narrative  EXAMINATION:  CT OF THE CHEST WITH CONTRAST 08/27/2023 1:25 pm    TECHNIQUE:  CT of the chest was performed with the administration of intravenous  contrast. Multiplanar reformatted images are provided for review. Automated  exposure control, iterative reconstruction, and/or weight based adjustment of  the mA/kV was utilized to reduce the radiation dose to as low as reasonably  achievable.    COMPARISON:  CT chest 08/15/2015 from [facility].  HISTORY:  ORDERING SYSTEM PROVIDED HISTORY: Non-small cell lung cancer, unspecified  laterality (HCC)  TECHNOLOGIST PROVIDED HISTORY:    STAT Creatinine as needed:->Yes  Staging for lung cancer  Reason for Exam: Pt states follow up from lung CA, staging for lung CA, no  chest pain or SOB    FINDINGS:  Mediastinum: Cardiac size normal.  No pericardial effusion.  Aortic valve  repair noted.  Atherosclerotic disease evident.  A few subcentimeter  scattered mediastinal lymph nodes are noted.  For example 6 mm short axis  lymph node anterior to the right mainstem bronchus and similar size anterior  to the left mainstem bronchus at the level of carina.  No significant change.    12 mm rim left thyroid lobe nodule.  No follow-up recommended.  Small to  moderate size sliding hiatal hernia.  Also present in 2017 although now  larger.    Lungs/pleura: There is mild centrilobular emphysema.  In the anterior segment  right upper lobe there is pleural-based spiculated nodule along chest wall  underlying the 4th rib.  This measures 1.8 x 1.5 x 1.8 cm.  Consistent with  lung cancer as per history provided.  No additional lung nodules.    Mild scarring in the medial basal right lower lobe.  No pleural effusion or  pneumothorax.    Upper Abdomen: No  acute process.  No suspicious mass.    Soft Tissues/Bones: Degenerative changes in the bones.  No aggressive lytic  or blastic lesion.    Impression  1. 1.8 x 1.5 x 1.8 cm spiculated nodule in the anterior segment right upper  lobe along the chest wall underlying the 4th rib. Consistent with lung cancer  as per history.  2. No additional lung nodules.  3. No adenopathy.  4. Small to moderate sliding hiatal hernia showing some interval enlargement  from 2017.      Assessment & Plan  1. Incisional ventral hernia.  The abdominal hernia is likely an incisional hernia, possibly related to previous surgeries including a hysterectomy. The presence of colon within the hernia sac is suspected. A CT scan with IV contrast will be ordered to guide surgical planning. Given her history of lung cancer and a positive Cologuard result 07-06-23, a colonoscopy is recommended prior to any hernia repair. Instructions for pre-procedure preparation include maintaining a clear liquid diet the day before the procedure, taking 4 Dulcolax tablets at 10 AM, and consuming a mixture of MiraLAX  and Gatorade between 6 and 8 PM. She should continue with clear liquids until midnight and abstain from food or drink after midnight. On the day of the procedure, she should arrive 2 hours early and arrange for a driver. Any small polyps or abnormalities detected during the colonoscopy will be biopsied and sent to the lab for analysis. If the colonoscopy reveals any concerning findings, the hernia repair will be postponed. If the hernia becomes extremely painful, hard, or cannot be pushed in, immediate medical attention is advised due to the risk of strangulation.    2. Non-small cell lung cancer.  She has a history of non-small cell lung cancer with a small nodule near her rib. She underwent 5 days of radiation therapy in June. A follow-up CAT scan is scheduled for the end of September, with an appointment with the radiologist on 01/20/2024. Continue  follow up with oncology as scheduled.    3. Positive Cologuard  A colonoscopy will be scheduled within the next month due to a positive Cologuard result  and her history of lung cancer prior to hernia repair. Instructions for pre-procedure preparation have been provided.    ENCOUNTER DIAGNOSES    ICD-10-CM    1. Ventral hernia without obstruction or gangrene  K43.9 CT ABDOMEN PELVIS W IV CONTRAST Additional Contrast? None      2. Incisional hernia, without obstruction or gangrene  K43.2 CT ABDOMEN PELVIS W IV CONTRAST Additional Contrast? None      3. Positive colorectal cancer screening using Cologuard test  R19.5           SURGERY/PROCEDURE SCHEDULING  PROCEDURE NAME: Diagnostic colonoscopy    PROCEDURE DIAGNOSIS: Positive Cologuard    ANESTHESIA TYPE: General    BLOOD THINNERS: None    CLEARANCE: None    [facility]PITAL: [facility]    Bowel prep. Pre-admission testing per protocol with [facility]. Procedure risk and complications, risk-benefit alternatives, recovery restrictions were all discussed with the patient at length.  Pre-operative, intra-operative, post-operative details regarding procedure discussed with patient and all questions were answered. Patient understands and wants to proceed.    Return for Discuss CT result, Further evaluation with Dr. Marlou.    The patient, Mercadez Albarracin is a 78 y.o. female, was seen with a total time spent of 35 minutes for the visit on this date of service by the E/M provider. The time component had both face to face and non face to face time spent in determining the total time component.  Counseling and education regarding the patient's diagnosis listed below and the patient's options regarding those diagnoses were also included in determining the time component.    Please note that portions of this note were completed with Dragon dictation, the computer voice recognition software.  Quite often unanticipated grammatical, syntax, homophones, and other  interpretive errors are inadvertently transcribed by the computer software.  Please disregard these errors and any other errors that [place] have escaped final proofreading.     The patient (or guardian, if applicable) and other individuals in attendance with the patient were advised that Artificial Intelligence will be utilized during this visit to record, process the conversation to generate a clinical note, and support improvement of the AI technology. The patient (or guardian, if applicable) and other individuals in attendance at the appointment consented to the use of AI, including the recording.       Electronically signed by Freddy CHRISTELLA Kiang, APRN - CNP  on 11/28/2023 at 8:21 AM

## 2023-11-25 NOTE — Telephone Encounter (Signed)
 Patient called Radiation Oncology office stating that she needed a hernia repair and needed a CT scan chest.  She questioned if having a CT scan was ok since she just completed radiation approximately one month ago.  Writer reassured patient that there was no safety issue with having a CT scan but that due to inflammation from the radiation therapy to her lung, the site of her tumor [place] look like the tumor is larger when it is not.  She voiced understanding and appreciation for  the reassurance.

## 2023-11-25 NOTE — Telephone Encounter (Signed)
 Name: Diana Willis  DOB: 01/03/1946  MRN: [id number]    Oncology Navigation Follow-Up Note    Contact Type:  Telephone    Notes: navigation complete. Pt. Will F/U with RO while local, however plans to go to [place] . Writer removing self from care team.       Electronically signed by Doyce Ewings, RN on 11/25/2023 at 3:54 PM

## 2023-11-25 NOTE — Patient Instructions (Signed)
 CT scan ordered.  Please call [phone] to schedule your testing.     The office will be calling you soon to schedule colonoscopy, please call our office if you do not receive a phone call within 1 week.        [facility] General Surgery  Oretha Birch, MD, FACS  Michial Akin. Elvin Hammer, APRN-CNP  [address]  [place] , [place]  [address]  Phone: [phone]  Fax: [phone]    Miralax (Polyethylene Glycol)  STOP Aspirin 7 Days prior to Procedure  STOP all other Blood Thinners 5 Days prior to Procedure    **DO NOT EAT ANY SOLID FOOD THE DAY BEFORE YOUR PROCEDURE**  **YOU MUST BE ON A CLEAR LIQUID DIET ONLY**    Approved Clear Liquids:  Any flavor of soda except Red or Purple  Fruit Juice without pulp  Coffee or tea without dairy products  Jell-O without fruit or toppings, No Red or Purple  Pop-sickles or [place] Ice, No Red or Purple  Chicken or Beef broth and bouillon      DRINK PLENTY OF WATER THROUGHOUT THE DAY    At 10:00 am the day before your procedure, take all 4 Dulcolax Tablets.  At 6:00 pm the day before your procedure, mix the ENTIRE bottle of Miralax (238 gram or Close to it) with 64 ounces of Gatorade (NOT RED or PURPLE).  You must consume the entire 64 ounces by 8:00 pm.  Continue clear liquid diet up until midnight.    NOTHING TO EAT, DRINK, SMOKE, OR CHEW AFTER MIDNIGHT    You may brush your teeth, rinse, gargle, and spit.  Heart or Blood pressure medications ONLY with a small sip of water, unless otherwise directed.  You will be scheduled for a pre-admission phone call prior to your procedure for your chart to be reviewed with a nurse to give you more specific instructions.  Shower with regular soap and water.    YOU MUST HAVE AN ADULT EITHER DRIVE YOU OR RIDE IN A CAB WITH YOU

## 2023-12-02 ENCOUNTER — Inpatient Hospital Stay: Admit: 2023-12-02 | Discharge: 2023-12-02 | Payer: MEDICARE

## 2023-12-02 DIAGNOSIS — K439 Ventral hernia without obstruction or gangrene: Principal | ICD-10-CM

## 2023-12-02 LAB — POCT CREATININE: POC Creatinine: 1.2 mg/dL (ref 0.6–1.4)

## 2023-12-02 MED ORDER — SODIUM CHLORIDE 0.9 % IV BOLUS
0.9 | Freq: Once | INTRAVENOUS | Status: AC
Start: 2023-12-02 — End: 2023-12-02
  Administered 2023-12-02: 14:00:00 80 mL via INTRAVENOUS

## 2023-12-02 MED ORDER — IOPAMIDOL 76 % IV SOLN
76 | Freq: Once | INTRAVENOUS | Status: AC | PRN
Start: 2023-12-02 — End: 2023-12-02
  Administered 2023-12-02: 14:00:00 75 mL via INTRAVENOUS

## 2023-12-02 MED ORDER — SODIUM CHLORIDE FLUSH 0.9 % IV SOLN
0.9 | INTRAVENOUS | Status: DC | PRN
Start: 2023-12-02 — End: 2023-12-05
  Administered 2023-12-02: 14:00:00 10 mL via INTRAVENOUS

## 2023-12-02 NOTE — Telephone Encounter (Signed)
 CT abdomen pelvis results reviewed as detailed below, discussed with patient:  IMPRESSION:  1.  Postsurgical changes of ventral hernia repair with redemonstrated two  abdominal ventral wall hernias, with larger right-sided hernia containing a  portion of the transverse colon without evidence of obstruction.     2.  Few loops of fluid-filled small bowel with maximal diameter upper limits  of normal without obvious transition point.  Recommend clinical correlation  for signs of bowel obstruction.     3.  Moderate sliding-type hiatal hernia.     4. Sigmoid diverticulosis without evidence of diverticulitis.    Patient states no significant changes in her symptoms since appointment with me.  She states that her bowels are moving normally, no issues with constipation.  Had a bowel movement today.  No bloody or black tarry stools.  Denies nausea and vomiting.  No significant abdominal pain.  Hernia remains soft with no overlying skin changes.  Discussed with her that there are 2 abdominal ventral wall hernias noted, larger right sided hernia containing a portion of the transverse colon.  Again she has no signs of small bowel obstruction.  Advised her to make sure she is getting enough fiber in her diet due to diverticulosis, increase water intake, avoid constipation, take MiraLAX if needed.  Discussed emergent signs of worsening abdominal pain, nausea, vomiting, hardening of hernia, constipation, all signs to be seen immediately.  Otherwise she will follow-up at her neck scheduled appointment with Dr. Marlou on September 18.  Advised her to contact the office if any of her symptoms worsen or change in the meantime.

## 2023-12-06 ENCOUNTER — Inpatient Hospital Stay: Admit: 2023-12-06 | Discharge: 2023-12-06 | Payer: MEDICARE

## 2023-12-06 ENCOUNTER — Encounter

## 2023-12-06 NOTE — Telephone Encounter (Signed)
 Pt called and left a voicemail to schedule her diagnostic colonoscopy.

## 2023-12-07 ENCOUNTER — Encounter

## 2023-12-07 MED ORDER — DULCOLAX 5 MG PO TBEC
5 | ORAL_TABLET | ORAL | 0 refills | Status: AC
Start: 2023-12-07 — End: ?

## 2023-12-07 MED ORDER — POLYETHYLENE GLYCOL 3350 17 GM/SCOOP PO POWD
17 | ORAL | 0 refills | Status: AC
Start: 2023-12-07 — End: ?

## 2023-12-07 MED ORDER — ONDANSETRON HCL 4 MG PO TABS
4 | ORAL_TABLET | Freq: Four times a day (QID) | ORAL | 0 refills | Status: AC | PRN
Start: 2023-12-07 — End: ?

## 2023-12-07 NOTE — Telephone Encounter (Signed)
 PAT per anesthesia protocol.  Prep and Zofran PRN sent to patient's pharmacy, signing encounter.

## 2023-12-07 NOTE — Telephone Encounter (Addendum)
 Returned patient call to scheduled a diagnostic colonoscopy.      PRE OP AND BOWEL PREP INSTRUCTIONS    STOP Aspirin 7 Days prior to Procedure  Stop All Vitamins/Minerals/ Herbs 7 Days prior to Procedure  STOP all other Blood Thinners 5 Days prior to Procedure      **DO NOT EAT ANY SOLID FOOD THE DAY BEFORE YOUR PROCEDURE**  **YOU MUST BE ON A CLEAR LIQUID DIET ONLY**    Approved Clear Liquids:  Any flavor of water or soda except Red or Purple  Fruit Juice without pulp  Coffee or tea without dairy products  Jell-O without fruit or toppings, No Red or Purple  Pop-sickles or [place] Ice, No Red or Purple  Chicken or Beef broth and bouillon      DRINK PLENTY OF WATER THROUGHOUT THE DAY    At 10:00 am the day before your procedure, take all 4 Dulcolax Tablets.  At 6:00 pm the day before your procedure, mix the ENTIRE bottle of Miralax  (238 grams or 8.3oz) with 64 ounces of Gatorade, Gatorade Zero or Propel Water (NOT RED or PURPLE).  You must consume the entire 64 ounces by 8:00 pm.  Continue clear liquid diet up until midnight.    NOTHING TO EAT, DRINK, SMOKE, OR CHEW AFTER MIDNIGHT    You [place] brush your teeth, rinse, gargle, and spit.  Heart or Blood pressure medications ONLY with a small sip of water, unless otherwise directed.  Shower with regular soap and water.    YOU MUST BE ACCOMPANIED BY ANOTHER ADULT UPON ARRIVAL AND DISCHARGE OF YOUR PROCEDURE,  THEY MUST REMAIN PRESENT DURING THE LENGTH OF YOUR PROCEDURE         MY PROCEDURE IS SCHEDULED QNM:[id number] COLONOSCOPY    Date of Procedure: Monday 01/03/2024  Time:11:30 am  Arrival Time:9:30 am  Pre-Admission Testing: Thursday 12/23/2023 at 10:15 am , a Nurse from [employer] will call you.    Location:  [facility]     Your scripts have been sent to: [facility]     Follow Up Appointment at [facility] Surgery:Wednesday 01/19/2024 at 9:45 am   With Dr.Thusay.    Patient fully instructed/ Patient stated she will view  instructions in active my chart 12/07/23    Orders pending

## 2023-12-15 NOTE — Telephone Encounter (Addendum)
 Patient called and canceled her procedure, due to she went to a different doctor.  Patient also canceled her office visits.

## 2023-12-30 ENCOUNTER — Encounter: Payer: MEDICARE | Attending: Surgery

## 2024-01-03 ENCOUNTER — Inpatient Hospital Stay: Payer: MEDICARE

## 2024-01-10 ENCOUNTER — Inpatient Hospital Stay: Admit: 2024-01-10 | Payer: MEDICARE | Attending: Internal Medicine

## 2024-01-10 DIAGNOSIS — C3411 Malignant neoplasm of upper lobe, right bronchus or lung: Principal | ICD-10-CM

## 2024-01-10 LAB — CREATININE
Creatinine: 1 mg/dL — ABNORMAL HIGH (ref 0.6–0.9)
Est, Glom Filt Rate: 58 mL/min/1.73m2 — ABNORMAL LOW (ref >60)

## 2024-01-10 MED ORDER — SODIUM CHLORIDE 0.9 % IV BOLUS
0.9 | Freq: Once | INTRAVENOUS | Status: AC
Start: 2024-01-10 — End: 2024-01-10
  Administered 2024-01-10: 16:00:00 80 mL via INTRAVENOUS

## 2024-01-10 MED ORDER — NORMAL SALINE FLUSH 0.9 % IV SOLN
0.9 | INTRAVENOUS | Status: DC | PRN
Start: 2024-01-10 — End: 2024-01-13
  Administered 2024-01-10: 16:00:00 10 mL via INTRAVENOUS

## 2024-01-10 MED ORDER — IOPAMIDOL 76 % IV SOLN
76 | Freq: Once | INTRAVENOUS | Status: AC | PRN
Start: 2024-01-10 — End: 2024-01-10
  Administered 2024-01-10: 16:00:00 75 mL via INTRAVENOUS

## 2024-01-19 ENCOUNTER — Encounter: Payer: MEDICARE | Attending: Surgery

## 2024-01-20 ENCOUNTER — Inpatient Hospital Stay: Admit: 2024-01-20 | Discharge: 2024-01-29 | Payer: MEDICARE | Attending: Internal Medicine

## 2024-01-20 VITALS — BP 126/68 | HR 94 | Temp 97.90000°F | Resp 16 | Wt 139.6 lb

## 2024-01-20 DIAGNOSIS — C3411 Malignant neoplasm of upper lobe, right bronchus or lung: Principal | ICD-10-CM

## 2024-01-20 DIAGNOSIS — Z510 Encounter for antineoplastic radiation therapy: Secondary | ICD-10-CM

## 2024-01-20 NOTE — Progress Notes (Signed)
 "Diana Willis  01/20/2024  9:57 AM      Vitals:    01/20/24 0941   BP: 126/68   Pulse: 94   Resp: 16   Temp: 97.9 F (36.6 C)   SpO2: 97%    :     Pain Assessment: 0-10  Pain Level: 0       Wt Readings from Last 1 Encounters:   01/20/24 63.3 kg (139 lb 9.6 oz)                Current Outpatient Medications:     omeprazole (PRILOSEC) 10 MG delayed release capsule, Take 1 capsule by mouth daily, Disp: , Rfl:     bisacodyl (DULCOLAX) 5 MG EC tablet, Take 4 tablets at 10:00 am day before procedure (Patient not taking: Reported on 01/20/2024), Disp: 4 tablet, Rfl: 0    polyethylene glycol (MIRALAX ) 17 GM/SCOOP powder, Mix 238 grams of Miralax  with 64 ounces Gatorade (no red or purple) start drinking at 6:00 pm finish by 8:00 pm all of the prep (Patient not taking: Reported on 01/20/2024), Disp: 238 g, Rfl: 0    ondansetron  (ZOFRAN ) 4 MG tablet, Take 1 tablet by mouth every 6 hours as needed for Nausea or Vomiting (Patient not taking: Reported on 01/20/2024), Disp: 10 tablet, Rfl: 0               FALLS RISK SCREEN  Instructions:  Assess the patient and enter the appropriate indicators that are present for fall risk identification.   Total the numbers entered and assign a fall risk score from Table 2.  Reassess patient at a minimum every 12 weeks or with status change.    Assessment   Date  01/20/2024     1.  Mental Ability: confusion/cognitively impaired 0     2.  Elimination Issues: incontinence, frequency 0       3.  Ambulatory: use of assistive devices (walker, cane, off-loading devices),        attached to equipment (IV pole, oxygen) 0     4.  Sensory Limitations: dizziness, vertigo, impaired vision 0     5.  Age less than 65        0     6.  Age 31 or greater 1     7.  Medication: diuretics, strong analgesics, hypnotics, sedatives,        antihypertensive agents 0   8.  Falls:  recent history of falls within the last 3 months (not to include slipping or        tripping) 0   TOTAL 1    If score of 4 or greater was education  given? Yes           TABLE 2   Risk Score Risk Level Plan of Care   0-3 Little or  No Risk 1.  Provide assistance as indicated for ambulation activities  2.  Reorient confused/cognitively impaired patient  3.  Chair/bed in low position, stretcher/bed with siderails up except when performing patient care activities  5.  Educate patient/family/caregiver on falls prevention  6.  Reassess in 12 weeks or with any noted change in patient condition which places them at a risk for a fall   4-6 Moderate Risk 1.  Provide assistance as indicated for ambulation activities  2.  Reorient confused/cognitively impaired patient  3.  Chair/bed in low position, stretcher/bed with siderails up except when performing patient care activities  4.  Educate patient/family/caregiver on falls prevention  7 or   Higher High Risk 1.  Place patient in easily observable treatment room  2.  Patient attended at all times by family member or staff  3.  Provide assistance as indicated for ambulation activities  4.  Reorient confused/cognitively impaired patient  5.  Chair/bed in low position, stretcher/bed with siderails up except when performing patient care activities  6.  Educate patient/family/caregiver on falls prevention         PLAN: Patient is seen today in follow up with Dr. Edris. She denies any respiratory issues at this time. She is anxious to go over recent CT results. MD updated and in for pt eval. Pt to f/u in 6 months.          Jeoffrey Friendly, RN           "

## 2024-01-20 NOTE — Progress Notes (Signed)
 "   [facility]            Radiation Oncology          [address]          [place], [place] [address]        MALVA: [phone]        F: [phone]       [email or web address]           Date of Service: 01/20/2024     Location:  [facility] Radiation Oncology,   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 female who had a CT abdomen to evaluate for abdominal hernia, incidentally a lesion was noted in the right lung suspicious for neoplastic process.  Patient underwent a biopsy of the suspicious lesion which was positive for adenocarcinoma.  She had staging with an FDG PET scan which showed FDG avid lesion in the right upper lobe measuring 1.8 cm in greatest dimension, no other evidence of metastatic disease was noted.  She also had an MRI of the brain which was negative for intracranial metastatic disease.  Patient was treated with SBRT to the right upper lobe lesion completed on 10/13/2023.    Patient presents today for her first posttreatment follow-up.  She is doing well.  She denies chest wall pain, changes in breathing, cough, dysphagia.  She continues to smoke although she is trying to cut down.  She has posttreatment CT chest which we have positive response to therapy without evidence of disease progression.    MEDICATIONS:    Current Outpatient Medications:     omeprazole (PRILOSEC) 10 MG delayed release capsule, Take 1 capsule by mouth daily, Disp: , Rfl:     bisacodyl (DULCOLAX) 5 MG EC tablet, Take 4 tablets at 10:00 am day before procedure (Patient not taking: Reported on 01/20/2024), Disp: 4 tablet, Rfl: 0    polyethylene glycol (MIRALAX ) 17 GM/SCOOP powder, Mix 238 grams of Miralax  with 64 ounces Gatorade (no red or purple) start drinking at  6:00 pm finish by 8:00 pm all of the prep (Patient not taking: Reported on 01/20/2024), Disp: 238 g, Rfl: 0    ondansetron  (ZOFRAN ) 4 MG tablet, Take 1 tablet by mouth every 6 hours as needed for Nausea or Vomiting (Patient not taking: Reported on 01/20/2024), Disp: 10 tablet, Rfl: 0    ALLERGIES:  No Known Allergies      REVIEW OF SYSTEMS:    A full 14 point review of systems was performed and assessed and found to be negative except as noted above.      PHYSICAL EXAMINATION:    CHAPERONE: Family/friend/companieon Present    ECOG:  0 Asymptomatic    VITAL SIGNS: BP 126/68   Pulse 94   Temp 97.9 F (36.6 C) (Temporal)   Resp 16   Wt 63.3 kg (139 lb 9.6 oz)   SpO2 97%   BMI 27.26 kg/m   GENERAL:  General appearance is that of a well-nourished, well-developed in no apparent distress.  LUNGS:  Pulmonary effort normal.   ABDOMEN:  Soft, nontender, non distended  EXTREMITIES:  No edema.  No calf tenderness.  MSK:  No spinal tenderness. Normal ROM.  NEUROLOGICAL: Alert and oriented. Strength and sensation intact bilaterally. No focal deficits.   PSYCH: Mood normal, behavior normal.      LABS:  WBC   Date Value Ref Range Status   10/26/2022 9.8 3.5 - 11.3 k/uL Final     Neutrophils Absolute   Date Value Ref Range Status   10/26/2022 6.64 1.50 - 8.10 k/uL Final     Hemoglobin   Date Value Ref Range Status   10/26/2022 15.8 (H) 11.9 - 15.1 g/dL Final     Platelets   Date Value Ref Range Status   10/26/2022 245 138 - 453 k/uL Final     No results found for: CA125, CEA, CA199  No results found for: PSA    IMAGING:   CT chest:  IMPRESSION:  1. Interval decrease in size of the right upper lobe spiculated nodule, now measuring 1.5 x 1.1 cm.   2. New opacity extending inferiorly medially from this spiculated nodule, possibly post obstructive atelectasis or post-treatment changes. Additional follow-up recommended.  3. Stable appearance of several paratracheal and right hilar lymph nodes, with no new or enlarging lymph  nodes.      ASSESSMENT AND PLAN:  Diana Willis is a 78 y.o. female with a diagnosis of adenocarcinoma of the right upper lobe cT1 N0 M0    Patient declined surgical resection.    Status post SBRT to 5000 cGy in 5 fractions completed on 10/13/2023    Posttreatment CT chest reviewed with the patient and showed positive response to therapy with posttreatment changes without evidence of disease progression.  She is to continue with serial monitoring with repeat CT chest to be done in 6 months.  Patient states she is moving to [place]  for the winter and would like to get her CT chest done in [place] .     Patient is to return to the radiation clinic in 9 months after she comes back from her trip to [place] .    Patient advised to continue follow-up with medical oncology in [place]  as well.    Patient was in agreement with my recommendations. All questions were answered to their satisfaction. Patient was advised to contact us  anytime should they have any questions or concerns.     I plan on seeing Diana Willis for continuous follow-up for monitoring of disease control as well as treatment related toxicities in coordination with medical oncology as part of a longitudinal oncological care.    Electronically signed by Norval Coward, MD on 01/20/2024 at 10:06 AM        Medications Prescribed:   New Prescriptions    No medications on file       Orders:   Orders Placed This Encounter   Procedures    CT CHEST W CONTRAST       CC:  Patient Care Team:  Zoraida Lapine, APRN - NP as PCP - General     Total time spent on this case on the day of encounter is 30 minutes. This time includes combination of one or more of the following - review of necessary tests, review of pertinent medical records from the EMR, performing medically appropriate examination and evaluation, counseling and educating the patient/family/caregiver, ordering necessary medical tests, procedures etc., documenting the clinical information in the electronic medical record,  care coordination, referring and communicating with other health care providers and interpretation of results independently. This note is created with the assistance of a  speech recognition program.  While intending to generate a document that actually reflects the content of the visit, the document can still have some errors including those of syntax and sound a like substitutions which [place] escape proof reading.  It such instances, actual meaning can be extrapolated by contextual diversion.    "
